# Patient Record
Sex: Female | Born: 1985
Health system: Southern US, Community
[De-identification: ages and names within clinical notes are randomized; demographics above are authoritative.]

## PROBLEM LIST (undated history)

## (undated) ENCOUNTER — Inpatient Hospital Stay (HOSPITAL_COMMUNITY): Payer: Self-pay

## (undated) DIAGNOSIS — M62838 Other muscle spasm: Secondary | ICD-10-CM

## (undated) DIAGNOSIS — Z975 Presence of (intrauterine) contraceptive device: Secondary | ICD-10-CM

## (undated) DIAGNOSIS — E559 Vitamin D deficiency, unspecified: Secondary | ICD-10-CM

## (undated) DIAGNOSIS — R519 Headache, unspecified: Secondary | ICD-10-CM

## (undated) DIAGNOSIS — R7303 Prediabetes: Secondary | ICD-10-CM

## (undated) DIAGNOSIS — F32A Depression, unspecified: Secondary | ICD-10-CM

## (undated) DIAGNOSIS — N393 Stress incontinence (female) (male): Secondary | ICD-10-CM

## (undated) DIAGNOSIS — T7840XA Allergy, unspecified, initial encounter: Secondary | ICD-10-CM

## (undated) DIAGNOSIS — J209 Acute bronchitis, unspecified: Secondary | ICD-10-CM

## (undated) DIAGNOSIS — F419 Anxiety disorder, unspecified: Secondary | ICD-10-CM

## (undated) DIAGNOSIS — M549 Dorsalgia, unspecified: Secondary | ICD-10-CM

## (undated) DIAGNOSIS — M2550 Pain in unspecified joint: Secondary | ICD-10-CM

## (undated) HISTORY — DX: Depression, unspecified: F32.A

## (undated) HISTORY — DX: Other muscle spasm: M62.838

## (undated) HISTORY — DX: Anxiety disorder, unspecified: F41.9

## (undated) HISTORY — DX: Dorsalgia, unspecified: M54.9

## (undated) HISTORY — DX: Prediabetes: R73.03

## (undated) HISTORY — DX: Pain in unspecified joint: M25.50

## (undated) HISTORY — PX: COLONOSCOPY: SHX174

## (undated) HISTORY — DX: Stress incontinence (female) (male): N39.3

## (undated) HISTORY — PX: NO PAST SURGERIES: SHX2092

## (undated) HISTORY — DX: Vitamin D deficiency, unspecified: E55.9

## (undated) HISTORY — DX: Allergy, unspecified, initial encounter: T78.40XA

## (undated) HISTORY — DX: Presence of (intrauterine) contraceptive device: Z97.5

## (undated) HISTORY — DX: Headache, unspecified: R51.9

---
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
## 2009-08-25 ENCOUNTER — Emergency Department (HOSPITAL_COMMUNITY): Admission: EM | Admit: 2009-08-25 | Discharge: 2009-08-25 | Payer: Self-pay | Admitting: Emergency Medicine

## 2009-08-28 ENCOUNTER — Emergency Department (HOSPITAL_COMMUNITY): Admission: EM | Admit: 2009-08-28 | Discharge: 2009-08-29 | Payer: Self-pay | Admitting: Emergency Medicine

## 2010-05-25 ENCOUNTER — Ambulatory Visit: Payer: Self-pay | Admitting: Obstetrics & Gynecology

## 2010-05-25 LAB — CONVERTED CEMR LAB: Pap Smear: NEGATIVE

## 2010-05-26 ENCOUNTER — Encounter (INDEPENDENT_AMBULATORY_CARE_PROVIDER_SITE_OTHER): Payer: Self-pay | Admitting: Family Medicine

## 2010-05-26 ENCOUNTER — Encounter: Payer: Self-pay | Admitting: Obstetrics & Gynecology

## 2010-05-26 LAB — CONVERTED CEMR LAB
Hemoglobin: 12.9 g/dL (ref 12.0–15.0)
MCHC: 32 g/dL (ref 30.0–36.0)
MCV: 84.3 fL (ref 78.0–100.0)
RBC: 4.78 M/uL (ref 3.87–5.11)
TSH: 1.609 microintl units/mL (ref 0.350–4.500)
Trich, Wet Prep: NONE SEEN
WBC: 6.5 10*3/uL (ref 4.0–10.5)

## 2010-06-05 ENCOUNTER — Ambulatory Visit: Payer: Self-pay | Admitting: Obstetrics and Gynecology

## 2010-11-16 LAB — POCT URINALYSIS DIPSTICK
Bilirubin Urine: NEGATIVE
Glucose, UA: NEGATIVE mg/dL
Ketones, ur: NEGATIVE mg/dL
Nitrite: POSITIVE — AB
pH: 6 (ref 5.0–8.0)

## 2011-01-19 NOTE — H&P (Signed)
[name], [name]              ACCOUNT NO.:  [id number]   MEDICAL RECORD NO.:  [id number]          PATIENT TYPE:  INP   LOCATION:  9168                          FACILITY:  [facility]   PHYSICIAN:  Naima A. Dillard, M.D. DATE OF BIRTH:  01-30-1986   DATE OF ADMISSION:  12/08/2005  DATE OF DISCHARGE:                                HISTORY & PHYSICAL   [name] is an 25 year old single black female, primigravida, at 63  weeks' gestation, who presents for induction of labor secondary to  postdates.  Her pregnancy has been followed by the [facility] OB[facility]GYN  MD Service and has been remarkable for:  (1) Questionable last menstrual  period.  (2) Smoker and quit smoking in the first trimester.  (3)  Depression.  (4) Obesity.  (5) Adolescent.  (6) Rubella is not immune.  (7)  Increased Down syndrome risk.  (8) Group B strep positive.  Her new OB were  collected on June 05, 2005.  Hemoglobin 12.3, hematocrit 37.8, platelets  286,000, blood type P positive, antibody negative, sickle cell trait  negative, RPR nonreactive, rubella nonimmune, hepatitis B surface antigen  negative, HIV not reactive, cystic fibrosis negative.  One hour Glucola from  July 03, 2005 was within normal limits.  Quad screen from July 03, 2005, showed increased Down syndrome risk.  One hour Glucola from September 26, 2005, was within normal limits.  The hemoglobin at that time was 12.9.  Culture of the vaginal tract for group B strep, gonorrhea, and Chlamydia on  November 06, 2005, showed positive group B strep and negative gonorrhea and  Chlamydia.   HISTORY OF PRESENT PREGNANCY:  The patient presented for care at [facility] on June 05, 2005, at [facility] OB[facility].  She had an  ultrasound on that day giving [facility] November 30, 2005 which is the best [facility] since  the patient had a questionable last menstrual period.  She was treated for a  bacterial vaginosis at that visit as well.  Ultrasonography at 18  weeks'  gestation shows growth consistent with previous dating.  All anatomy was  seen.  The patient had a 1 hour Glucola at that time due to obesity.  She  also had a quad screen drawn that day.  At 19 weeks, the patient was seen  for some sudden onset of abdominal cramping.  Her exam was normal.  She had  a visit at the following day with Dr. Normand Sloop to review her abnormal quad  screen.  The patient was referred to Dr. Sherrie George for further evaluation.  Dr.  Eber Jones ultrasound was normal for the patient.  Ultrasonography at 31  weeks' gestation showed normal anatomy, normal fluids, cephalic  presentation.  Sporadically, the patient had glycosuria with normal dextrose  sticks during her pregnancy.  She remained normotensive and no proteinuria  throughout.  Ultrasonography at 37-1/2 weeks was performed secondary to  patient measuring size less than dates.  [place] estimated fetal weight  percentile was at the 50th percentile, 7 pounds 4 ounces.  The patient had a  positive group B strep culture  with no penicillin allergy, and the treatment  was reviewed was reviewed with her, and the rest of her prenatal care has  been unremarkable.   OBSTETRICAL HISTORY:  She is a primigravida.   MEDICAL HISTORY:  1.  She has no medication allergies.  2.  She experienced menarche at the age of 32-9 with 28-30 days cycles      lasting 7 days.  3.  She reports having had the usual childhood illnesses.  4.  She has a history of acute bronchitis.  5.  She also has a history of depression and was never treated for that.   SURGICAL HISTORY:  Negative.   FAMILY MEDICAL HISTORY:  Remarkable for a father with chronic hypertension,  multiple family members with diabetes, mother with kidney removed, maternal  grandfather with stroke, paternal grandmother 2 paternal aunts and maternal  grandmother all have rheumatoid arthritis.  Mother has had breast cancer.   GENETIC HISTORY:  The patient had a heart murmur as a  baby that self-  resolved.  She has a paternal uncle with muscular dystrophy.   SOCIAL HISTORY:  The patient is single.  Father of the baby is involved.  His name is Sherry Ferguson.  The patient is of the [place] faith.  She is a high-  Garment/textile technologist.  Father of the baby has his GED and is employed full-time  as a Academic librarian.  The patient is unemployed.  The patient was a smoker and  stopped in the first trimester.  They deny any alcohol, tobacco, or illicit  drug use during the pregnancy.   OBJECTIVE DATA:  VITAL SIGNS:  Stable.  She is afebrile.  HEENT:  Grossly within normal limits.  CHEST:  Clear to auscultation.  HEART:  Regular rate and rhythm.  ABDOMEN:  Gravid and contour with fundal height extending approximately 40  cm above the pubic symphysis.  Fetal heart rate is reassuring.  PELVIC EXAM:  Performed in the office last week.  EXTREMITIES:  Normal.   ASSESSMENT:  1.  Intrauterine pregnancy at term.  2.  Favorable cervix.   PLAN:  1.  Admit to birthing suites per Dr. Normand Sloop.  2.  The patient will start on low-dose Pitocin.  3.  She will receive penicillin G for a group B strep prophylaxis.      Cam Hai, C.N.M.      Naima A. Normand Sloop, M.D.  Electronically Signed    KS/MEDQ  D:  12/08/2005  T:  12/08/2005  Job:  [id number]

## 2011-02-01 ENCOUNTER — Ambulatory Visit (INDEPENDENT_AMBULATORY_CARE_PROVIDER_SITE_OTHER): Payer: Managed Care, Other (non HMO) | Admitting: Family Medicine

## 2011-02-01 ENCOUNTER — Encounter: Payer: Self-pay | Admitting: Family Medicine

## 2011-02-01 VITALS — BP 130/80 | HR 78 | Ht 67.2 in | Wt 247.0 lb

## 2011-02-01 DIAGNOSIS — J45909 Unspecified asthma, uncomplicated: Secondary | ICD-10-CM | POA: Insufficient documentation

## 2011-02-01 DIAGNOSIS — Z0000 Encounter for general adult medical examination without abnormal findings: Secondary | ICD-10-CM

## 2011-02-01 DIAGNOSIS — E669 Obesity, unspecified: Secondary | ICD-10-CM

## 2011-02-01 LAB — POCT URINALYSIS DIPSTICK
Bilirubin, UA: NEGATIVE
Glucose, UA: NEGATIVE
Leukocytes, UA: NEGATIVE
Nitrite, UA: NEGATIVE
Urobilinogen, UA: NEGATIVE

## 2011-02-01 LAB — CBC WITH DIFFERENTIAL/PLATELET
Eosinophils Absolute: 0.1 10*3/uL (ref 0.0–0.7)
Hemoglobin: 13 g/dL (ref 12.0–15.0)
Lymphocytes Relative: 49 % — ABNORMAL HIGH (ref 12–46)
Lymphs Abs: 2.2 10*3/uL (ref 0.7–4.0)
Monocytes Relative: 7 % (ref 3–12)
Neutro Abs: 1.9 10*3/uL (ref 1.7–7.7)
Neutrophils Relative %: 41 % — ABNORMAL LOW (ref 43–77)
Platelets: 311 10*3/uL (ref 150–400)
RBC: 4.76 MIL/uL (ref 3.87–5.11)
WBC: 4.6 10*3/uL (ref 4.0–10.5)

## 2011-02-01 LAB — COMPREHENSIVE METABOLIC PANEL
ALT: 20 U/L (ref 0–35)
Albumin: 4.6 g/dL (ref 3.5–5.2)
CO2: 25 mEq/L (ref 19–32)
Chloride: 105 mEq/L (ref 96–112)
Potassium: 4.3 mEq/L (ref 3.5–5.3)
Sodium: 139 mEq/L (ref 135–145)
Total Bilirubin: 0.4 mg/dL (ref 0.3–1.2)
Total Protein: 7.6 g/dL (ref 6.0–8.3)

## 2011-02-01 LAB — LIPID PANEL
Cholesterol: 168 mg/dL (ref 0–200)
LDL Cholesterol: 109 mg/dL — ABNORMAL HIGH (ref 0–99)
VLDL: 19 mg/dL (ref 0–40)

## 2011-02-01 NOTE — Patient Instructions (Addendum)
Do the terminal extension exercise 3 sets of 10 3 times a day for the next 3 weeks Continue on your allergy medications and which her exercise regimen.

## 2011-02-01 NOTE — Progress Notes (Signed)
Subjective:    Patient ID: Sherry Ferguson, female    DOB: 04-30-1986, 25 y.o.   MRN: [id number]  HPI she is here for a complete examination. She does have underlying allergies and asthma. She has been seen recently by Dr. [place] Callas who recommended allergy desensitization. She is taking Singulair and albuterol as well as Qvar and seems to be doing quite nicely on it. Her other main concern is difficulty with bilateral knee pain she's had for the last year especially on the right. She has tried ibuprofen on occasion. She states that going up and downstairs cause difficulty. She does here a popping sensation but no locking or effusion. No previous history of injury to her knee. She did see her gynecologist in September. Her family and social history was reviewed. She is married and in school. She does have 2 children.    Review of Systems  Constitutional: Negative.   HENT: Negative.   Eyes: Negative.   Respiratory: Negative.   Cardiovascular: Negative.   Gastrointestinal: Negative.   Genitourinary: Negative.   Skin: Negative.   Neurological: Negative.   Psychiatric/Behavioral: Negative.        Objective:   Physical Exam BP 130/80  Pulse 78  Ht 5' 7.2" (1.707 m)  Wt 247 lb (112.038 kg)  BMI 38.46 kg/m2  LMP 01/12/2011  General Appearance:    Alert, cooperative, no distress, appears stated age  Head:    Normocephalic, without obvious abnormality, atraumatic  Eyes:    PERRL, conjunctiva/corneas clear, EOM's intact, fundi    benign  Ears:    Normal TM's and external ear canals  Nose:   Nares normal, mucosa normal, no drainage or sinus   tenderness  Throat:   Lips, mucosa, and tongue normal; teeth and gums normal  Neck:   Supple, no lymphadenopathy;  thyroid:  no   enlargement/tenderness/nodules; no carotid   bruit or JVD     Lungs:     Clear to auscultation bilaterally without wheezes, rales or     ronchi; respirations unlabored  Chest Wall:    No tenderness or deformity   Heart:     Regular rate and rhythm, S1 and S2 normal, no murmur, rub   or gallop  Breast Exam:    Deferred to GYN  Abdomen:     Soft, non-tender, nondistended, normoactive bowel sounds,    no masses, no hepatosplenomegaly  Genitalia:    Deferred to GYN     Extremities:   No clubbing, cyanosis or edema.exam of both knees does show crepitus on palpation of the patellae. Exam of both knees shows crepitus on palpation of the patellae. Anterior drawer and Murray's testing and negative.   Pulses:   2+ and symmetric all extremities  Skin:   Skin color, texture, turgor normal, no rashes or lesions  Lymph nodes:   Cervical, supraclavicular, and axillary nodes normal  Neurologic:   CNII-XII intact, normal strength, sensation and gait; reflexes 2+ and symmetric throughout          Psych:   Normal mood, affect, hygiene and grooming.          Assessment & Plan:  Allergic rhinitis. Asthma. Obesity. Bilateral chondromalacia patella. I will do routine blood screening on her. Also discussed diet and exercise with her especially in regard to carbohydrates. Encouraged her to remain as physically active as she can. Discussed ideal weight for her which would be below 150 pounds. Also instructed her on terminal extension exercises for her knees and she  will return here if continued difficulty.

## 2011-02-02 ENCOUNTER — Encounter: Payer: Self-pay | Admitting: Family Medicine

## 2011-02-02 ENCOUNTER — Telehealth: Payer: Self-pay

## 2011-02-02 DIAGNOSIS — J45909 Unspecified asthma, uncomplicated: Secondary | ICD-10-CM

## 2011-02-02 NOTE — Telephone Encounter (Signed)
CALLED PT LEFT MESSAGE THAT LABS LOOK GOOD

## 2011-02-02 NOTE — Assessment & Plan Note (Signed)
PT SEES [facility] FOR ALLERGIES

## 2011-04-30 ENCOUNTER — Other Ambulatory Visit: Payer: Self-pay | Admitting: Medical

## 2011-04-30 ENCOUNTER — Encounter: Payer: Self-pay | Admitting: Medical

## 2011-04-30 ENCOUNTER — Ambulatory Visit (INDEPENDENT_AMBULATORY_CARE_PROVIDER_SITE_OTHER): Payer: Managed Care, Other (non HMO) | Admitting: Medical

## 2011-04-30 DIAGNOSIS — R109 Unspecified abdominal pain: Secondary | ICD-10-CM

## 2011-04-30 DIAGNOSIS — R110 Nausea: Secondary | ICD-10-CM

## 2011-04-30 DIAGNOSIS — R102 Pelvic and perineal pain: Secondary | ICD-10-CM

## 2011-04-30 DIAGNOSIS — N949 Unspecified condition associated with female genital organs and menstrual cycle: Secondary | ICD-10-CM

## 2011-04-30 LAB — POCT URINE PREGNANCY: Preg Test, Ur: NEGATIVE

## 2011-04-30 LAB — POCT URINALYSIS DIPSTICK
Bilirubin, UA: NEGATIVE
Glucose, UA: NEGATIVE
Nitrite, UA: NEGATIVE
Spec Grav, UA: 1.015
Urobilinogen, UA: NEGATIVE

## 2011-04-30 MED ORDER — HYDROCODONE-ACETAMINOPHEN 5-500 MG PO TABS: 1.0000 | ORAL_TABLET | Freq: Four times a day (QID) | ORAL | Status: AC | PRN

## 2011-04-30 MED ORDER — PROMETHAZINE HCL 25 MG PO TABS: 25.0000 mg | ORAL_TABLET | Freq: Four times a day (QID) | ORAL | Status: AC | PRN

## 2011-04-30 NOTE — Progress Notes (Signed)
  Subjective:   HPI  Sherry Ferguson is a 25 y.o. female who presents for 1 day hx/o moderate to severe abdominal cramping pain, like a severe menstrual cramp.  She is a P71G2 female, both children full term vaginal delivery prior.  She has Mirena contraception currently.  She notes no prior similar pain.  Last evening at 8pm started having severe lower cramping.  Pain was so bad she couldn't move or talk.  Husband was going to take her to the ED, but she declined.  The pain has subsided somewhat over the last 8 hours or so.  She still is having lots of cramping, lower abdominal pain.   Pain worse with walking or sitting.  Seems to be a little better supine.  She notes nausea and sweats last evening with the pain.  No other aggravating or relieving factors.  No other c/o.  The following portions of the patient's history were reviewed and updated as appropriate: allergies, current medications, past family history, past medical history, past social history, past surgical history and problem list.  Past Medical History  Diagnosis Date  . Allergy   . Asthma   . IUD (intrauterine device) in place     Review of Systems Constitutional: +sweats;  denies fever, chills, unexpected weight change, anorexia, fatigue Dermatology: denies rash Cardiology: denies chest pain, palpitations, edema Respiratory: denies cough, shortness of breath, wheezing Gastroenterology: +abdominal pain, cramping, nausea;  Denies vomiting, diarrhea, constipation, BMs regular Musculoskeletal: denies arthralgias, myalgias, joint swelling, back pain Urology: denies dysuria, difficulty urinating, hematuria, urinary frequency, urgency Neurology: no headache, weakness, tingling, numbness GYN; no discharge, no hx/o STD, no pelvic pain with intercourse, no bleeding, LMP 04/08/11     Objective:   Physical Exam  General appearance: alert, no distress, WD/WN, black female, obese Neck: supple, no lymphadenopathy, no thyromegaly, no  masses Heart: RRR, normal S1, S2, no murmurs Lungs: CTA bilaterally, no wheezes, rhonchi, or rales Abdomen: +bs,soft, moderate generalized tenderness, non distended, no masses, no hepatomegaly, no splenomegaly Back: non tender Musculoskeletal: LE nontender, no swelling, no obvious deformity, normal ROM Extremities: no edema, no cyanosis, no clubbing Pulses: 2+ symmetric, upper and lower extremities, normal cap refill Psychiatric: normal affect, behavior normal, pleasant  Pelvic: normal external genitalia, slight white discharge in vault, no cervical erythema, Mirena strings in place, moderate adnexal and uterine tenderness, but no mass, no cervical motion tenderness, swabs taken, exam chaperoned by nurse   Assessment :    Encounter Diagnoses  Name Primary?  . Abdominal pain Yes  . Pelvic pain in female   . Nausea       Plan:   Wet prep today with few clue cells, otherwise no yeast, no trich, no WBC, basically unremarkable.  Urinalysis and pregnancy negative.  Discussed differential which could include ovarian cyst, cyst rupture, uterine problem, renal stone, or otherwise GI or GU problems.  Will set up for ultrasound, send for labs and STD screening.  Will call with results and plan.  In the meantime, script given for Promethazine and Lortab prn use.  Call/return if worse.  Otherwise, f/u with ultrasound this week as scheduled.

## 2011-04-30 NOTE — Patient Instructions (Signed)
Pelvic Pain Pelvic pain is pain below the belly button and located between your hips. Acute pain may last a few hours or days. Chronic pelvic pain may last weeks and months. The cause these different types of pelvic may be different. The pain may be dull or sharp, mild or severe and can interfere with your daily activities. Write down and tell your caregiver:   Exactly where the pain is located.   If it comes and goes or is there all the time.   When it happens (with sex, urination, bowel movement, etc.)   If the pain is related to your menstrual period or stress.  Your caregiver will take a full history and do a complete physical exam and Pap test. CAUSES  Painful menstrual periods (dysmenorrhea).   Normal ovulation (Mittelschmertz) that occurs in the middle of the menstrual cycle every month.   The pelvic organs get engorged with blood just before the menstrual period (pelvic congestive syndrome).   Scar tissue from an infection or past surgery (pelvic adhesions).   Cancer of the female pelvic organs. When there is pain with cancer, it has been there for a long time.   The lining of the uterus (endometrium) abnormally grows in places like the pelvis and on the pelvic organs (endometriosis).   A form of endometriosis with the lining of the uterus present inside of the muscle tissue of the uterus (adenomyosis).   Fibroid tumor (noncancerous) in the uterus.   Bladder problems such as infection, bladder spasms of the muscle tissue of the bladder.   Intestinal problems (irritable bowel syndrome, colitis, an ulcer or gastrointestinal infection).   Polyps of the cervix or uterus.   Pregnancy in the tube (ectopic pregnancy).   The opening of the cervix is too small for the menstrual blood to flow through it (cervical stenosis).   Physical or sexual abuse (past or present).   Musculo-skeletal problems from poor posture, problems with the vertebrae of the lower back or the uterine  pelvic muscles falling (prolapse).   Psychological problems such as depression or stress.   IUD (intrauterine device) in the uterus.  DIAGNOSIS Tests to make a diagnosis depends on the type, location, severity and what causes the pain to occur. Tests that may be needed include:  Blood tests.  Urine tests   Ultrasound.   X-rays.  CT Scan.   MRI.  Laparoscopy.   Major surgery.   TREATMENT Treatment will depend on the cause of the pain, which includes:  Prescription or over-the-counter pain medication.  Antibiotics.   Birth control pills.   Hormone treatment.  Nerve blocking injections.   Physical therapy.   Antidepressants.  Counseling with a psychiatrist or psychologist.   Minor or major surgery.   HOME CARE INSTRUCTIONS  Only take over-the-counter or prescription medicines for pain, discomfort or fever as directed by your caregiver.   Follow your caregiver's advice to treat your pain.   Rest.   Avoid sexual intercourse if it causes the pain.   Apply warm or cold compresses (which ever works best) to the pain area.   Do relaxation exercises such as yoga or meditation.   Try acupuncture.   Avoid stressful situations.   Try group therapy.   If the pain is because of a stomach/intestinal upset, drink clear liquids, eat a bland light food diet until the symptoms go away.  SEEK MEDICAL CARE IF:  You need stronger prescription pain medication.   You develop pain with sexual intercourse.  You have pain with urination.   You develop a temperature of 101 with the pain.   You are still in pain after 4 hours of taking prescription medication for the pain.   You need depression medication.   Your IUD is causing pain and you want it removed.  SEEK IMMEDIATE MEDICAL CARE IF YOU DEVELOP:  Very severe pain or tenderness.   Fainting, chills, severe weakness or dehydration.   Heavy vaginal bleeding or passing solid tissue.   You develop a temperature  of 101 with the pain.   You have blood in the urine.   You are being physically or sexually abused.   You have uncontrolled vomiting and diarrhea.   You are depressed and afraid of harming yourself or someone else.  Document Released: 09/27/2004 Document Re-Released: 06/22/2008 [facility] Patient Information 2011 [place], [place].

## 2011-05-01 ENCOUNTER — Telehealth: Payer: Self-pay | Admitting: *Deleted

## 2011-05-01 LAB — COMPREHENSIVE METABOLIC PANEL
AST: 14 U/L (ref 0–37)
Alkaline Phosphatase: 69 U/L (ref 39–117)
BUN: 17 mg/dL (ref 6–23)
Creat: 0.68 mg/dL (ref 0.50–1.10)

## 2011-05-01 LAB — GC/CHLAMYDIA PROBE AMP, GENITAL: Chlamydia, DNA Probe: NEGATIVE

## 2011-05-01 LAB — CBC
HCT: 40.9 % (ref 36.0–46.0)
Hemoglobin: 13.1 g/dL (ref 12.0–15.0)
MCH: 27.5 pg (ref 26.0–34.0)
MCHC: 32 g/dL (ref 30.0–36.0)
MCV: 85.7 fL (ref 78.0–100.0)

## 2011-05-01 NOTE — Telephone Encounter (Addendum)
Message copied by Dorthula Perfect on Tue May 01, 2011  4:18 PM ------      Message from: Aleen Campi, DAVID S      Created: Tue May 01, 2011  1:40 PM       CMP, CBC, UA, Upreg, amylase, lipase ALL normal.  GC/Chlamydia culture normal.  We will call with results of abdominal and pelvic ultrasounds.  How are her symptoms today?   Pt notified of lab and culture results.  Pt stated that her symptoms are still the same.  Will notify pt when ultrasounds of abdomen and pelvic come in of results.  CM, LPN

## 2011-05-03 ENCOUNTER — Ambulatory Visit
Admission: RE | Admit: 2011-05-03 | Discharge: 2011-05-03 | Disposition: A | Discharge status: Home / Self Care | Payer: Managed Care, Other (non HMO) | Source: Ambulatory Visit | Attending: Medical | Admitting: Medical

## 2011-05-03 DIAGNOSIS — R102 Pelvic and perineal pain: Secondary | ICD-10-CM

## 2011-05-08 ENCOUNTER — Telehealth: Payer: Self-pay | Admitting: Medical

## 2011-05-08 NOTE — Telephone Encounter (Signed)
Message copied by Jac Canavan on Tue May 08, 2011  8:08 AM ------      Message from: Jac Canavan      Created: Tue May 08, 2011  6:29 AM       Call this morning for status update

## 2011-05-08 NOTE — Telephone Encounter (Signed)
I called and left msg.   I saw her recently for bad crampy lower abdominal pain, and we had workup including labs and ultrasound showing ovarian cyst.  I will await her call back.

## 2011-05-16 ENCOUNTER — Emergency Department (HOSPITAL_COMMUNITY): Payer: Managed Care, Other (non HMO)

## 2011-05-16 ENCOUNTER — Emergency Department (HOSPITAL_COMMUNITY)
Admission: EM | Admit: 2011-05-16 | Discharge: 2011-05-16 | Disposition: A | Discharge status: Home / Self Care | Payer: Managed Care, Other (non HMO) | Attending: Emergency Medicine | Admitting: Emergency Medicine

## 2011-05-16 DIAGNOSIS — R0789 Other chest pain: Secondary | ICD-10-CM | POA: Insufficient documentation

## 2011-05-16 DIAGNOSIS — M79609 Pain in unspecified limb: Secondary | ICD-10-CM | POA: Insufficient documentation

## 2011-05-16 DIAGNOSIS — R51 Headache: Secondary | ICD-10-CM | POA: Insufficient documentation

## 2011-09-05 ENCOUNTER — Emergency Department (HOSPITAL_BASED_OUTPATIENT_CLINIC_OR_DEPARTMENT_OTHER)
Admission: EM | Admit: 2011-09-05 | Discharge: 2011-09-05 | Disposition: A | Discharge status: Home / Self Care | Payer: Managed Care, Other (non HMO) | Attending: Emergency Medicine | Admitting: Emergency Medicine

## 2011-09-05 ENCOUNTER — Encounter (HOSPITAL_BASED_OUTPATIENT_CLINIC_OR_DEPARTMENT_OTHER): Payer: Self-pay | Admitting: Emergency Medicine

## 2011-09-05 DIAGNOSIS — J45909 Unspecified asthma, uncomplicated: Secondary | ICD-10-CM | POA: Insufficient documentation

## 2011-09-05 DIAGNOSIS — W57XXXA Bitten or stung by nonvenomous insect and other nonvenomous arthropods, initial encounter: Secondary | ICD-10-CM | POA: Insufficient documentation

## 2011-09-05 DIAGNOSIS — L0390 Cellulitis, unspecified: Secondary | ICD-10-CM

## 2011-09-05 DIAGNOSIS — S40269A Insect bite (nonvenomous) of unspecified shoulder, initial encounter: Secondary | ICD-10-CM | POA: Insufficient documentation

## 2011-09-05 MED ORDER — CEPHALEXIN 250 MG PO CAPS
500.0000 mg | ORAL_CAPSULE | Freq: Once | ORAL | Status: AC
  Administered 2011-09-05: 500 mg via ORAL
  Filled 2011-09-05: qty 2

## 2011-09-05 MED ORDER — CEPHALEXIN 500 MG PO CAPS: 500.0000 mg | ORAL_CAPSULE | Freq: Four times a day (QID) | ORAL | Status: AC

## 2011-09-05 NOTE — ED Provider Notes (Signed)
History     CSN: [id number]  Arrival date & time 09/05/11  2200   First MD Initiated Contact with Patient 09/05/11 2311      Chief Complaint  Patient presents with  . Insect Bite    (Consider location/radiation/quality/duration/timing/severity/associated sxs/prior treatment) The history is provided by the patient.   Pt c/o sore area post right upper arm, possible insect bite. Says first noted a couple days ago w two tiny bumps. Now has approx 1 cm diameter scab over area and notes surrounding skin is sl itchy and sore, mild redness. No hx same symptoms. No hx boil,abscess or mrsa. No axillary pain. States a day or so after onset, that family/friend saw a spider in the same area she had been sleeping. No definite bite or sting noted. Otherwise feels well. No fever or chills. No nv.  Past Medical History  Diagnosis Date  . Allergy   . Asthma   . IUD (intrauterine device) in place     History reviewed. No pertinent past surgical history.  Family History  Problem Relation Age of Onset  . Asthma Mother   . Diabetes Father   . Hyperlipidemia Father   . Hypertension Father     History  Substance Use Topics  . Smoking status: Former Smoker    Types: Cigarettes    Quit date: 09/04/2007  . Smokeless tobacco: Never Used  . Alcohol Use: 0.6 oz/week    1 Glasses of wine per week    OB History    Grav Para Term Preterm Abortions TAB SAB Ect Mult Living                  Review of Systems  Constitutional: Negative for fever and chills.  Musculoskeletal: Negative for joint swelling and arthralgias.  Skin: Negative for rash.  Neurological: Negative for numbness.    Allergies  Review of patient's allergies indicates no known allergies.  Home Medications   Current Outpatient Rx  Name Route Sig Dispense Refill  . ALBUTEROL SULFATE HFA 108 (90 BASE) MCG/ACT IN AERS Inhalation Inhale 2 puffs into the lungs every 6 (six) hours as needed. For shortness of breath and wheezing      . BECLOMETHASONE DIPROPIONATE 80 MCG/ACT IN AERS Inhalation Inhale 1 puff into the lungs as needed. For shortness of breath and wheezing    . IBUPROFEN 200 MG PO TABS Oral Take 600 mg by mouth every 6 (six) hours as needed. For pain     . MONTELUKAST SODIUM 10 MG PO TABS Oral Take 10 mg by mouth at bedtime.      Marland Kitchen LEVONORGESTREL 20 MCG/24HR IU IUD Intrauterine 1 each by Intrauterine route once.        Pulse 80  Temp(Src) 97.9 F (36.6 C) (Oral)  Resp 18  SpO2 100%  Physical Exam  Nursing note and vitals reviewed. Constitutional: She appears well-developed and well-nourished. No distress.  Eyes: Conjunctivae are normal. No scleral icterus.  Neck: Neck supple. No tracheal deviation present.  Cardiovascular: Normal rate.   Pulmonary/Chest: Effort normal. No respiratory distress.  Abdominal: Normal appearance.  Musculoskeletal: She exhibits no edema.  Neurological: She is alert.  Skin: Skin is warm and dry. No rash noted.       Pt with approx 1 cm diameter area to posterior right upper arm of scab, no purulent drainage from scabbed area. No fluctuance or abscess. Mild surround erythema a few cm. No axillary l/a/ no lymphangitis. Distal pulses palp.   Psychiatric: She  has a normal mood and affect.    ED Course  Procedures (including critical care time)     MDM  Discussed w pt, possible insect/spider bite or sting w early/v mild infxn/cellulitis. No fluctuance or abscess. No necrotic tissue.  Confirmed nkda w pt.  Keflex po.         Suzi Roots, MD 09/05/11 [id number]

## 2011-09-05 NOTE — ED Notes (Signed)
Pt reports possible spider bite. Pt has abscess to right posterior upper arm

## 2011-12-06 ENCOUNTER — Encounter (HOSPITAL_COMMUNITY): Payer: Self-pay | Admitting: Emergency Medicine

## 2011-12-06 ENCOUNTER — Emergency Department (HOSPITAL_COMMUNITY)
Admission: EM | Admit: 2011-12-06 | Discharge: 2011-12-06 | Disposition: A | Discharge status: Home / Self Care | Payer: Managed Care, Other (non HMO) | Attending: Emergency Medicine | Admitting: Emergency Medicine

## 2011-12-06 DIAGNOSIS — IMO0001 Reserved for inherently not codable concepts without codable children: Secondary | ICD-10-CM | POA: Insufficient documentation

## 2011-12-06 DIAGNOSIS — J45909 Unspecified asthma, uncomplicated: Secondary | ICD-10-CM | POA: Insufficient documentation

## 2011-12-06 DIAGNOSIS — J329 Chronic sinusitis, unspecified: Secondary | ICD-10-CM

## 2011-12-06 DIAGNOSIS — Z79899 Other long term (current) drug therapy: Secondary | ICD-10-CM | POA: Insufficient documentation

## 2011-12-06 MED ORDER — HYDROCOD POLST-CHLORPHEN POLST 10-8 MG/5ML PO LQCR: 5.0000 mL | Freq: Two times a day (BID) | ORAL | Status: DC | PRN

## 2011-12-06 MED ORDER — AMOXICILLIN-POT CLAVULANATE 875-125 MG PO TABS: 1.0000 | ORAL_TABLET | Freq: Two times a day (BID) | ORAL | Status: AC

## 2011-12-06 MED ORDER — ALBUTEROL SULFATE HFA 108 (90 BASE) MCG/ACT IN AERS
1.0000 | INHALATION_SPRAY | Freq: Four times a day (QID) | RESPIRATORY_TRACT | Status: DC | PRN
  Administered 2011-12-06: 2 via RESPIRATORY_TRACT
  Filled 2011-12-06: qty 6.7

## 2011-12-06 MED ORDER — ACETAMINOPHEN 325 MG PO TABS
650.0000 mg | ORAL_TABLET | Freq: Once | ORAL | Status: AC
Start: 2011-12-06 — End: 2011-12-06
  Administered 2011-12-06: 650 mg via ORAL
  Filled 2011-12-06: qty 2

## 2011-12-06 MED ORDER — HYDROCOD POLST-CHLORPHEN POLST 10-8 MG/5ML PO LQCR
5.0000 mL | Freq: Once | ORAL | Status: AC
  Administered 2011-12-06: 5 mL via ORAL
  Filled 2011-12-06: qty 5

## 2011-12-06 MED ORDER — AEROCHAMBER PLUS W/MASK LARGE MISC
1.0000 | Freq: Once | Status: DC
  Filled 2011-12-06: qty 1

## 2011-12-06 MED ORDER — AMOXICILLIN-POT CLAVULANATE 875-125 MG PO TABS: 1.0000 | ORAL_TABLET | Freq: Two times a day (BID) | ORAL | Status: DC

## 2011-12-06 NOTE — Discharge Instructions (Signed)

## 2011-12-06 NOTE — ED Notes (Signed)
Pt states she has had a fever off and on since Easter  Pt has been taking ibuprofen  Pt states she has headache, sore throat, abd cramping, hot flashes alternating with chills, sweating, light headed, nausea, and dizziness

## 2011-12-06 NOTE — ED Provider Notes (Signed)
History     CSN: [id number]  Arrival date & time 12/06/11  0418   First MD Initiated Contact with Patient 12/06/11 [phone]      Chief Complaint  Patient presents with  . Fever  . Sore Throat  . Generalized Body Aches    (Consider location/radiation/quality/duration/timing/severity/associated sxs/prior treatment) HPI Comments: Sports for 4-5 days she has been experiencing intermittent fevers with chills, generalized weakness and fatigue. She reports that she'll intermittently have headaches, episodes of dizziness without chest pain or syncope, nausea without vomiting and no diarrhea. Ibuprofen at home with minimal relief. It is allergies, used to be on Singulair although is currently not taking anything on a daily basis. Sinus congestion, pressure, sore throat or cough he swallowing due to the pain. Skin rash or stiff neck. Denies cervical lymphadenopathy. Has a significant history of asthma. She reports that she cannot currently find her albuterol inhaler. In addition she has a nebulizer but a piece of it is missing and ask if that piece can be replaced.  Patient is a 26 y.o. female presenting with fever and pharyngitis. The history is provided by the patient and a friend.  Fever Primary symptoms of the febrile illness include fever, fatigue, headaches, cough, nausea and myalgias. Primary symptoms do not include wheezing, shortness of breath, abdominal pain, diarrhea or rash.  The headache is associated with weakness. The headache is not associated with neck stiffness.   The myalgias are associated with weakness.   Sore Throat Associated symptoms include headaches. Pertinent negatives include no chest pain, no abdominal pain and no shortness of breath.    Past Medical History  Diagnosis Date  . Allergy   . Asthma   . IUD (intrauterine device) in place     History reviewed. No pertinent past surgical history.  Family History  Problem Relation Age of Onset  . Asthma Mother   .  Diabetes Father   . Hyperlipidemia Father   . Hypertension Father     History  Substance Use Topics  . Smoking status: Former Smoker    Types: Cigarettes    Quit date: 09/04/2007  . Smokeless tobacco: Never Used  . Alcohol Use: No    OB History    Grav Para Term Preterm Abortions TAB SAB Ect Mult Living                  Review of Systems  Constitutional: Positive for fever, chills, appetite change and fatigue.  HENT: Positive for congestion, sore throat, trouble swallowing and sinus pressure. Negative for ear pain and neck stiffness.   Respiratory: Positive for cough. Negative for shortness of breath and wheezing.   Cardiovascular: Negative for chest pain.  Gastrointestinal: Positive for nausea. Negative for abdominal pain and diarrhea.  Musculoskeletal: Positive for myalgias.  Skin: Negative for rash.  Neurological: Positive for dizziness, weakness and headaches. Negative for syncope.    Allergies  Review of patient's allergies indicates no known allergies.  Home Medications   Current Outpatient Rx  Name Route Sig Dispense Refill  . ALBUTEROL SULFATE HFA 108 (90 BASE) MCG/ACT IN AERS Inhalation Inhale 2 puffs into the lungs every 6 (six) hours as needed. For shortness of breath and wheezing     . BECLOMETHASONE DIPROPIONATE 80 MCG/ACT IN AERS Inhalation Inhale 1 puff into the lungs as needed. For shortness of breath and wheezing    . IBUPROFEN 200 MG PO TABS Oral Take 600 mg by mouth every 6 (six) hours as needed. For pain     .  LEVONORGESTREL 20 MCG/24HR IU IUD Intrauterine 1 each by Intrauterine route once.      . AMOXICILLIN-POT CLAVULANATE 875-125 MG PO TABS Oral Take 1 tablet by mouth 2 (two) times daily. 28 tablet 0  . HYDROCOD POLST-CPM POLST ER 10-8 MG/5ML PO LQCR Oral Take 5 mLs by mouth every 12 (twelve) hours as needed. 140 mL 0    BP 108/73  Pulse 108  Temp(Src) 98.1 F (36.7 C) (Oral)  Resp 18  SpO2 99%  LMP 11/16/2011  Physical Exam  Nursing note  and vitals reviewed. Constitutional: She is oriented to person, place, and time. She appears well-developed and well-nourished.  HENT:  Head: Normocephalic and atraumatic.  Nose: Right sinus exhibits maxillary sinus tenderness and frontal sinus tenderness. Left sinus exhibits maxillary sinus tenderness and frontal sinus tenderness.  Mouth/Throat: Uvula is midline and mucous membranes are normal. Oropharyngeal exudate present. No tonsillar abscesses.  Eyes: Pupils are equal, round, and reactive to light.  Neck: Trachea normal, normal range of motion and phonation normal. Neck supple. No thyromegaly present.  Cardiovascular: Regular rhythm.   Pulmonary/Chest: Effort normal and breath sounds normal. She has no wheezes. She has no rales.  Abdominal: Soft. There is no hepatosplenomegaly. There is no tenderness. There is no rebound, no guarding and no CVA tenderness.  Neurological: She is alert and oriented to person, place, and time.    ED Course  Procedures (including critical care time)  Labs Reviewed - No data to display No results found.   1. Sinusitis       MDM  Clinically the patient has a sinus postnasal drip, some posterior tonsillar exudate is present. No cervical adenopathy.. My plan is to put her on 2 weeks of antibiotics. Will ask RT to try to replace piece.  No resp distress.  Tussionex for treatment.          Gavin Pound. Oletta Lamas, MD 12/06/11 [phone]

## 2011-12-06 NOTE — ED Notes (Signed)
Pt reports having flu like symptoms and sore throat since Easter. States that she has been taking Ibuprofen with little relief. States she has not taken any OTC cold/flu medicine. Pt states she is nauseated, weak, and sore. Pt has a strong cough though nonproductive. Bilateral breath sounds CTA. Pt denies smoking cigarettes.

## 2011-12-07 ENCOUNTER — Emergency Department (HOSPITAL_COMMUNITY)
Admission: EM | Admit: 2011-12-07 | Discharge: 2011-12-07 | Disposition: A | Discharge status: Home / Self Care | Payer: Managed Care, Other (non HMO) | Attending: Emergency Medicine | Admitting: Emergency Medicine

## 2011-12-07 ENCOUNTER — Encounter (HOSPITAL_COMMUNITY): Payer: Self-pay | Admitting: Emergency Medicine

## 2011-12-07 DIAGNOSIS — J029 Acute pharyngitis, unspecified: Secondary | ICD-10-CM | POA: Insufficient documentation

## 2011-12-07 DIAGNOSIS — R000 Tachycardia, unspecified: Secondary | ICD-10-CM | POA: Insufficient documentation

## 2011-12-07 DIAGNOSIS — J45909 Unspecified asthma, uncomplicated: Secondary | ICD-10-CM | POA: Insufficient documentation

## 2011-12-07 DIAGNOSIS — J329 Chronic sinusitis, unspecified: Secondary | ICD-10-CM | POA: Insufficient documentation

## 2011-12-07 DIAGNOSIS — Z87891 Personal history of nicotine dependence: Secondary | ICD-10-CM | POA: Insufficient documentation

## 2011-12-07 MED ORDER — IBUPROFEN 600 MG PO TABS: 600.0000 mg | ORAL_TABLET | Freq: Four times a day (QID) | ORAL | Status: DC | PRN

## 2011-12-07 MED ORDER — IBUPROFEN 800 MG PO TABS
800.0000 mg | ORAL_TABLET | Freq: Once | ORAL | Status: AC
  Administered 2011-12-07: 800 mg via ORAL
  Filled 2011-12-07: qty 1

## 2011-12-07 MED ORDER — DEXAMETHASONE SODIUM PHOSPHATE 10 MG/ML IJ SOLN
10.0000 mg | Freq: Once | INTRAMUSCULAR | Status: AC
  Administered 2011-12-07: 10 mg via INTRAMUSCULAR
  Filled 2011-12-07: qty 1

## 2011-12-07 MED ORDER — OXYCODONE-ACETAMINOPHEN 5-325 MG/5ML PO SOLN: 5.0000 mL | Freq: Four times a day (QID) | ORAL | Status: AC | PRN

## 2011-12-07 NOTE — ED Notes (Signed)
Pt. Reports being tx here yesterday for sinus infx. States she has began to feel worse and her throat is hurting from the back of her mouth all the way into the throat. States she is unable to eat or drink and is fatigue. Reports her neck is also sore. States pain is an "18" on a scale 1-10. States when she coughs the phlegm is red. Reports dizziness when she stands. Reports she woke up this morning nausiated and dry heaved, nothing came up.

## 2011-12-07 NOTE — ED Provider Notes (Signed)
History     CSN: [id number]  Arrival date & time 12/07/11  1534   First MD Initiated Contact with Patient 12/07/11 1621      Chief Complaint  Patient presents with  . Sore Throat    (Consider location/radiation/quality/duration/timing/severity/associated sxs/prior treatment) The history is provided by the patient.   the patient is a generally healthy 67 female who presents to the emergency department for the second time in 2 days with a chief complaint of sore throat that began 5 days ago. There is an associated fever, chills, fatigue, headache, ear pain, cough, facial pain, neck pain and nausea only this morning. There is no associated visual change, change in hearing, chest pain, shortness of breath, abdominal pain, rash. He has been taking Tylenol and ibuprofen intermittently at home which have been successful in reducing her fever, though she has not taken any since yesterday. At her visit yesterday she was given a prescription for an antibiotic which she has started taking. She is returned to the emergency department for reevaluation because she feels that the pain in her throat is getting worse and the medication she was given yesterday for her throat pain is not effective enough to allow her to eat and drink (though she is able to swallow).  Past Medical History  Diagnosis Date  . Allergy   . Asthma   . IUD (intrauterine device) in place     History reviewed. No pertinent past surgical history.  Family History  Problem Relation Age of Onset  . Asthma Mother   . Diabetes Father   . Hyperlipidemia Father   . Hypertension Father     History  Substance Use Topics  . Smoking status: Former Smoker    Types: Cigarettes    Quit date: 09/04/2007  . Smokeless tobacco: Never Used  . Alcohol Use: No     Review of Systems  Constitutional: Positive for fever, chills and fatigue.  HENT: Positive for ear pain, congestion, sore throat, trouble swallowing, neck pain, postnasal drip  and sinus pressure. Negative for hearing loss, facial swelling, mouth sores, neck stiffness, voice change, tinnitus and ear discharge.   Eyes: Negative for pain and visual disturbance.  Respiratory: Positive for cough. Negative for chest tightness and shortness of breath.   Cardiovascular: Negative for chest pain and palpitations.  Gastrointestinal: Positive for nausea. Negative for vomiting and abdominal pain.  Musculoskeletal: Positive for myalgias. Negative for back pain and gait problem.  Skin: Negative for rash.  Neurological: Positive for dizziness, weakness and headaches. Negative for syncope.  Psychiatric/Behavioral: Negative for confusion.    Allergies  Review of patient's allergies indicates no known allergies.  Home Medications   Current Outpatient Rx  Name Route Sig Dispense Refill  . ALBUTEROL SULFATE HFA 108 (90 BASE) MCG/ACT IN AERS Inhalation Inhale 2 puffs into the lungs every 6 (six) hours as needed. For shortness of breath and wheezing     . AMOXICILLIN-POT CLAVULANATE 875-125 MG PO TABS Oral Take 1 tablet by mouth 2 (two) times daily. 28 tablet 0  . ASPIRIN-ACETAMINOPHEN-CAFFEINE 250-250-65 MG PO TABS Oral Take 1 tablet by mouth every 6 (six) hours as needed.    . BECLOMETHASONE DIPROPIONATE 80 MCG/ACT IN AERS Inhalation Inhale 1 puff into the lungs as needed. For shortness of breath and wheezing    . HYDROCOD POLST-CPM POLST ER 10-8 MG/5ML PO LQCR Oral Take 5 mLs by mouth every 12 (twelve) hours as needed. 140 mL 0  . OMEGA-3 FATTY ACIDS 1000 MG  PO CAPS Oral Take 2 g by mouth daily.    . IBUPROFEN 200 MG PO TABS Oral Take 600 mg by mouth every 6 (six) hours as needed. For pain     . LEVONORGESTREL 20 MCG/24HR IU IUD Intrauterine 1 each by Intrauterine route once.      . ADULT MULTIVITAMIN W/MINERALS CH Oral Take 1 tablet by mouth daily.      BP 105/74  Pulse 109  Temp(Src) 101.9 F (38.8 C) (Oral)  Resp 18  SpO2 96%  LMP 11/16/2011  Physical Exam    Constitutional: She is oriented to person, place, and time. She appears well-developed and well-nourished. Distressed: uncomfortable appearing.       Vital signs reviewed, significant for fever and tachycardia  HENT:  Head: Normocephalic and atraumatic. No trismus in the jaw.  Right Ear: Hearing, tympanic membrane, external ear and ear canal normal.  Left Ear: Hearing, tympanic membrane, external ear and ear canal normal.  Nose: Mucosal edema present. No rhinorrhea. Right sinus exhibits maxillary sinus tenderness and frontal sinus tenderness. Left sinus exhibits maxillary sinus tenderness and frontal sinus tenderness.  Mouth/Throat: Mucous membranes are normal. No uvula swelling. Oropharyngeal exudate, posterior oropharyngeal edema and posterior oropharyngeal erythema present. No tonsillar abscesses.  Cardiovascular: Regular rhythm and normal heart sounds.        Tachycardia with a rate of 104 palpated on examination  Pulmonary/Chest: Effort normal and breath sounds normal. No respiratory distress. She has no wheezes. She exhibits no tenderness.  Abdominal: Soft. Bowel sounds are normal. She exhibits no distension. There is no tenderness.  Musculoskeletal: She exhibits no edema and no tenderness.  Neurological: She is alert and oriented to person, place, and time. No cranial nerve deficit.  Skin: Skin is warm and dry. No rash noted.  Psychiatric: She has a normal mood and affect.    ED Course  Procedures (including critical care time)  Labs Reviewed - No data to display No results found.  Dx 1: Pharyngitis Dx 2: Sinusitis   MDM  Patient with recent diagnosis of sinusitis. Clinically, there is evidence of a sinusitis and pharyngitis on her examination. The abdomen she was started on yesterday is sufficient for treatment of streptococcal pharyngitis, if she does have this-therefore, a culture of her throat is not performed today. I advised her to continue taking the antibiotic. I will  write her for a small amount of stronger pain medication. She is also given a dose of Decadron in the emergency department and ibuprofen for fever reduction. She is nontoxic-appearing and will be discharged home once her vital signs have improved.      6:47 PM Fever reduced, HR improved. Will d.c home.  Shaaron Adler, [place] 12/07/11 [phone]

## 2011-12-07 NOTE — Discharge Instructions (Signed)
As we discussed, you can use numbing throat lozenges such as cepacol or chloraseptic to help with your throat pain. You can use the prescribed pain medication as necessary for severe pain. Continue to take the antibiotic.      Pharyngitis, Viral and Bacterial Pharyngitis is soreness (inflammation) or infection of the pharynx. It is also called a sore throat. CAUSES  Most sore throats are caused by viruses and are part of a cold. However, some sore throats are caused by strep and other bacteria. Sore throats can also be caused by post nasal drip from draining sinuses, allergies and sometimes from sleeping with an open mouth. Infectious sore throats can be spread from person to person by coughing, sneezing and sharing cups or eating utensils. TREATMENT  Sore throats that are viral usually last 3-4 days. Viral illness will get better without medications (antibiotics). Strep throat and other bacterial infections will usually begin to get better about 24-48 hours after you begin to take antibiotics. HOME CARE INSTRUCTIONS   If the caregiver feels there is a bacterial infection or if there is a positive strep test, they will prescribe an antibiotic. The full course of antibiotics must be taken. If the full course of antibiotic is not taken, you or your child may become ill again. If you or your child has strep throat and do not finish all of the medication, serious heart or kidney diseases may develop.   Drink enough water and fluids to keep your urine clear or pale yellow.   Only take over-the-counter or prescription medicines for pain, discomfort or fever as directed by your caregiver.   Get lots of rest.   Gargle with salt water ( tsp. of salt in a glass of water) as often as every 1-2 hours as you need for comfort.   Hard candies may soothe the throat if individual is not at risk for choking. Throat sprays or lozenges may also be used.  SEEK MEDICAL CARE IF:   Large, tender lumps in the  neck develop.   A rash develops.   Green, yellow-brown or bloody sputum is coughed up.   Your baby is older than 3 months with a rectal temperature of 100.5 F (38.1 C) or higher for more than 1 day.  SEEK IMMEDIATE MEDICAL CARE IF:   A stiff neck develops.   You or your child are drooling or unable to swallow liquids.   You or your child are vomiting, unable to keep medications or liquids down.   You or your child has severe pain, unrelieved with recommended medications.   You or your child are having difficulty breathing (not due to stuffy nose).   You or your child are unable to fully open your mouth.   You or your child develop redness, swelling, or severe pain anywhere on the neck.   You have a fever.   Your baby is older than 3 months with a rectal temperature of 102 F (38.9 C) or higher.   Your baby is 1 months old or younger with a rectal temperature of 100.4 F (38 C) or higher.  MAKE SURE YOU:   Understand these instructions.   Will watch your condition.   Will get help right away if you are not doing well or get worse.  Document Released: 08/20/2005 Document Revised: 08/09/2011 Document Reviewed: 11/17/2007 [facility] [place] Patient Information 2012 [place], [place].         Sinusitis Sinuses are air pockets within the bones of your face. The growth  of bacteria within a sinus leads to infection. The infection prevents the sinuses from draining. This infection is called sinusitis. SYMPTOMS  There will be different areas of pain depending on which sinuses have become infected.  The maxillary sinuses often produce pain beneath the eyes.   Frontal sinusitis may cause pain in the middle of the forehead and above the eyes.  Other problems (symptoms) include:  Toothaches.   Colored, pus-like (purulent) drainage from the nose.   Swelling, warmth, and tenderness over the sinus areas may be signs of infection.  TREATMENT  Sinusitis is most often determined by  an exam.X-rays may be taken. If x-rays have been taken, make sure you obtain your results or find out how you are to obtain them. Your caregiver may give you medications (antibiotics). These are medications that will help kill the bacteria causing the infection. You may also be given a medication (decongestant) that helps to reduce sinus swelling.  HOME CARE INSTRUCTIONS   Only take over-the-counter or prescription medicines for pain, discomfort, or fever as directed by your caregiver.   Drink extra fluids. Fluids help thin the mucus so your sinuses can drain more easily.   Applying either moist heat or ice packs to the sinus areas may help relieve discomfort.   Use saline nasal sprays to help moisten your sinuses. The sprays can be found at your local drugstore.  SEEK IMMEDIATE MEDICAL CARE IF:  You have a fever.   You have increasing pain, severe headaches, or toothache.   You have nausea, vomiting, or drowsiness.   You develop unusual swelling around the face or trouble seeing.  MAKE SURE YOU:   Understand these instructions.   Will watch your condition.   Will get help right away if you are not doing well or get worse.  Document Released: 08/20/2005 Document Revised: 08/09/2011 Document Reviewed: 03/19/2007 [facility] Patient Information 2012 [place], [place].         Salt Water Gargle This solution will help make your mouth and throat feel better. HOME CARE INSTRUCTIONS   Mix 1 teaspoon of salt in 8 ounces of warm water.   Gargle with this solution as much or often as you need or as directed. Swish and gargle gently if you have any sores or wounds in your mouth.   Do not swallow this mixture.  Document Released: 05/24/2004 Document Revised: 08/09/2011 Document Reviewed: 10/15/2008 [facility] Patient Information 2012 [place], [place].

## 2011-12-08 NOTE — ED Provider Notes (Signed)
Medical screening examination/treatment/procedure(s) were performed by non-physician practitioner and as supervising physician I was immediately available for consultation/collaboration.  Doug Sou, MD 12/08/11 0005

## 2012-06-09 ENCOUNTER — Encounter: Payer: Self-pay | Admitting: Medical

## 2012-06-09 ENCOUNTER — Ambulatory Visit (INDEPENDENT_AMBULATORY_CARE_PROVIDER_SITE_OTHER): Payer: Managed Care, Other (non HMO) | Admitting: Medical

## 2012-06-09 VITALS — BP 120/80 | HR 97 | Temp 98.6°F | Resp 18 | Wt 240.0 lb

## 2012-06-09 DIAGNOSIS — J45901 Unspecified asthma with (acute) exacerbation: Secondary | ICD-10-CM

## 2012-06-09 MED ORDER — LEVOCETIRIZINE DIHYDROCHLORIDE 5 MG PO TABS: 5.0000 mg | ORAL_TABLET | Freq: Every evening | ORAL | Status: DC

## 2012-06-09 MED ORDER — METHYLPREDNISOLONE SODIUM SUCC 125 MG IJ SOLR
125.0000 mg | Freq: Once | INTRAMUSCULAR | Status: AC
  Administered 2012-06-09: 125 mg via INTRAMUSCULAR

## 2012-06-09 MED ORDER — ALBUTEROL SULFATE HFA 108 (90 BASE) MCG/ACT IN AERS: 2.0000 | INHALATION_SPRAY | Freq: Four times a day (QID) | RESPIRATORY_TRACT | Status: DC | PRN

## 2012-06-09 MED ORDER — BECLOMETHASONE DIPROPIONATE 80 MCG/ACT IN AERS: 2.0000 | INHALATION_SPRAY | Freq: Two times a day (BID) | RESPIRATORY_TRACT | Status: DC

## 2012-06-09 MED ORDER — ALBUTEROL SULFATE (2.5 MG/3ML) 0.083% IN NEBU: 2.5000 mg | INHALATION_SOLUTION | Freq: Four times a day (QID) | RESPIRATORY_TRACT | Status: DC | PRN

## 2012-06-09 NOTE — Patient Instructions (Signed)
Begin back on Qvar, 2 puffs twice daily to help CONTROL symptoms. Continue this for at least the next 1-2 months during the fall.    Use your albuterol inhaler, 2 puffs every 4-6 hours OR the albuterol nebulized solution as needed for wheezing.  This is your RESCUE inhaler.   Begin back on Xyzal prescription antihistamine or OTC Zyrtec for allergy triggers such as mold  Rest, drink plenty of fluids.   We gave you a shot of steroid today to help calm down current symptoms.    If not improving, then return.  otherwise recheck in 3-4 weeks on asthma.

## 2012-06-09 NOTE — Progress Notes (Signed)
Subjective: Here for about a month of difficulty breathing.  Has hx/o asthma.  She notes that she normally gets asthma flare up during season changes in spring and fall and through other triggers such as mold and dust.  In general she has been having cough and wheezing intermittent for a month but worse this past weekend.  Using her nebulizer since she is out of inhaler and Qvar.  She moved out of her home to a new place on 05/31/12 due to mold issues.  This also stirred up dust.  Denies fever, sore throat, congestion, sneezing, itchy nose, nausea, vomiting.   Past Medical History  Diagnosis Date  . Allergy   . Asthma   . IUD (intrauterine device) in place    ROS as in HPI     Objective:   Physical Exam  Filed Vitals:   06/09/12 1615  BP: 120/80  Pulse: 97  Temp: 98.6 F (37 C)  Resp: 18    General appearance: alert, no distress, WD/WN  HEENT: normocephalic, sclerae anicteric, TMs pearly, nares patent, no discharge or erythema, pharynx normal Oral cavity: MMM, no lesions Neck: supple, no lymphadenopathy, no thyromegaly, no masses Heart: RRR, normal S1, S2, no murmurs Lungs:diffuse wheezes, no rhonchi or rales Pulses normal Ext: no edema or cyanosis   Assessment and Plan :    Encounter Diagnosis  Name Primary?  Marland Kitchen Asthma exacerbation Yes     98% room air pulse on presentation but quite wheezy.   1 round of albuterol nebulized in office.  discussed her symptoms, concerns, and prior medication regimen.  Solumedrol 125mg  IM given in office.  Scripts to restart Qvar, albuterol HFA or nebulized prn q4-6 hours, restart zyrtec, and recheck in 3-4 wk, sooner prn.

## 2012-06-30 ENCOUNTER — Encounter: Payer: Self-pay | Admitting: Medical

## 2012-06-30 ENCOUNTER — Ambulatory Visit (INDEPENDENT_AMBULATORY_CARE_PROVIDER_SITE_OTHER): Payer: Managed Care, Other (non HMO) | Admitting: Medical

## 2012-06-30 VITALS — BP 108/80 | HR 80 | Temp 98.2°F | Resp 16 | Wt 240.0 lb

## 2012-06-30 DIAGNOSIS — J40 Bronchitis, not specified as acute or chronic: Secondary | ICD-10-CM

## 2012-06-30 DIAGNOSIS — E669 Obesity, unspecified: Secondary | ICD-10-CM

## 2012-06-30 DIAGNOSIS — J45901 Unspecified asthma with (acute) exacerbation: Secondary | ICD-10-CM

## 2012-06-30 MED ORDER — AZITHROMYCIN 500 MG PO TABS: 500.0000 mg | ORAL_TABLET | Freq: Every day | ORAL | Status: AC

## 2012-06-30 MED ORDER — BUDESONIDE-FORMOTEROL FUMARATE 160-4.5 MCG/ACT IN AERO: 2.0000 | INHALATION_SPRAY | Freq: Two times a day (BID) | RESPIRATORY_TRACT | Status: DC

## 2012-06-30 NOTE — Progress Notes (Signed)
Subjective: Here for recheck.  I saw her 06/09/12 for asthma exacerbation at which time we restarted zyrtec daily, Qvar BID and albuterol prn.   However, she has been using Albuterol BID along with Qvar every day.   Had been doing well the last few weeks until a week ago started getting sick.   Her kids have had a cold and she got it from them.  She notes in additional to asthma being flared up again, has cough, congestion, low grade fever, productive sputum x 1.5 wk.  She moved out of her home to a new place on 05/31/12 due to mold issues.    Past Medical History  Diagnosis Date  . Allergy   . Asthma   . IUD (intrauterine device) in place    ROS as in HPI    Objective:   Physical Exam  Filed Vitals:   06/30/12 1014  BP: 108/80  Pulse: 80  Temp: 98.2 F (36.8 C)  Resp: 16    General appearance: alert, no distress, WD/WN  HEENT: normocephalic, sclerae anicteric, TMs pearly, nares with purulent discharge and mild erythema, pharynx normal Oral cavity: MMM, no lesions Neck: supple, no lymphadenopathy, no thyromegaly, no masses Heart: RRR, normal S1, S2, no murmurs Lungs: scattered wheezes, + rhonchi or rales Pulses normal Ext: no edema or cyanosis   Assessment and Plan :    Encounter Diagnoses  Name Primary?  Marland Kitchen Asthma exacerbation Yes  . Bronchitis   . Obesity    Pulse ox 98%, pulse 85bpm.   Discussed symptoms, treatment, and will change to Symbicort for better control, change albuterol to prn, not BID use, c/t zyrtec, rest, hydrate well, and call if not improving in 1 wk.  In general for asthma control, c/t Symbicort until about mid November.  Advised similar controller regimen in spring and fall.  Usually not symptomatic in the summer.     Obesity - she is begin followed by weight loss clinic.  Getting B12 shots monthly, will be starting HCG shots, and oral medication.

## 2012-06-30 NOTE — Addendum Note (Signed)
Addended by: Jac Canavan on: 06/30/2012 08:38 PM   Modules accepted: Orders

## 2012-06-30 NOTE — Patient Instructions (Addendum)
Stop qvar.  Begin Symbicort 2 puffs twice daily for prevention.   You can continue Albuterol rescue inhaler, 2 puffs every 4-6 hours as need for wheezing/tightness.     Begin Azithromycin antibiotic, 1 tablet daily for 3 days.  Drink plenty of water daily.   Rest accordingly.    As things improved, continue Symbicort for the next 2-4 weeks.   If things seem stable then, you can try going to once daily with Symbicort.  Call or return in 1-2 months depending on symptoms.    I would try to use a preventative regimen of allergy medication such as zyrtec, and preventative asthma medication such as Symbicort prior to spring and fall seasons yearly.

## 2012-09-21 ENCOUNTER — Encounter (HOSPITAL_COMMUNITY): Payer: Self-pay

## 2012-09-21 ENCOUNTER — Inpatient Hospital Stay (HOSPITAL_COMMUNITY)
Admission: EM | Admit: 2012-09-21 | Discharge: 2012-09-23 | DRG: 189 | Disposition: A | Discharge status: Home / Self Care | Payer: 59 | Attending: Internal Medicine | Admitting: Internal Medicine

## 2012-09-21 ENCOUNTER — Emergency Department (HOSPITAL_COMMUNITY): Payer: 59

## 2012-09-21 DIAGNOSIS — Z23 Encounter for immunization: Secondary | ICD-10-CM

## 2012-09-21 DIAGNOSIS — J45909 Unspecified asthma, uncomplicated: Secondary | ICD-10-CM

## 2012-09-21 DIAGNOSIS — E669 Obesity, unspecified: Secondary | ICD-10-CM

## 2012-09-21 DIAGNOSIS — E876 Hypokalemia: Secondary | ICD-10-CM

## 2012-09-21 DIAGNOSIS — Z6841 Body Mass Index (BMI) 40.0 and over, adult: Secondary | ICD-10-CM

## 2012-09-21 DIAGNOSIS — J9600 Acute respiratory failure, unspecified whether with hypoxia or hypercapnia: Principal | ICD-10-CM

## 2012-09-21 DIAGNOSIS — R000 Tachycardia, unspecified: Secondary | ICD-10-CM | POA: Diagnosis present

## 2012-09-21 DIAGNOSIS — J209 Acute bronchitis, unspecified: Secondary | ICD-10-CM

## 2012-09-21 DIAGNOSIS — R0902 Hypoxemia: Secondary | ICD-10-CM

## 2012-09-21 DIAGNOSIS — J45901 Unspecified asthma with (acute) exacerbation: Secondary | ICD-10-CM

## 2012-09-21 LAB — CBC WITH DIFFERENTIAL/PLATELET
Hemoglobin: 12.9 g/dL (ref 12.0–15.0)
Lymphs Abs: 2.6 10*3/uL (ref 0.7–4.0)
Monocytes Relative: 8 % (ref 3–12)
Neutro Abs: 4.2 10*3/uL (ref 1.7–7.7)
Neutrophils Relative %: 51 % (ref 43–77)
Platelets: 252 10*3/uL (ref 150–400)
RBC: 4.62 MIL/uL (ref 3.87–5.11)
WBC: 8.3 10*3/uL (ref 4.0–10.5)

## 2012-09-21 LAB — BASIC METABOLIC PANEL
BUN: 11 mg/dL (ref 6–23)
Chloride: 106 mEq/L (ref 96–112)
GFR calc Af Amer: >90 mL/min (ref >90)
Glucose, Bld: 105 mg/dL — ABNORMAL HIGH (ref 70–99)
Potassium: 3.4 mEq/L — ABNORMAL LOW (ref 3.5–5.1)
Sodium: 139 mEq/L (ref 135–145)

## 2012-09-21 LAB — URINALYSIS, ROUTINE W REFLEX MICROSCOPIC
Bilirubin Urine: NEGATIVE
Hgb urine dipstick: NEGATIVE
Nitrite: NEGATIVE
Specific Gravity, Urine: 1.024 (ref 1.005–1.030)
Urobilinogen, UA: 1 mg/dL (ref 0.0–1.0)
pH: 5.5 (ref 5.0–8.0)

## 2012-09-21 LAB — PREGNANCY, URINE: Preg Test, Ur: NEGATIVE

## 2012-09-21 MED ORDER — ALBUTEROL (5 MG/ML) CONTINUOUS INHALATION SOLN
10.0000 mg/h | INHALATION_SOLUTION | RESPIRATORY_TRACT | Status: AC
  Administered 2012-09-21: 10 mg/h via RESPIRATORY_TRACT
  Filled 2012-09-21: qty 20

## 2012-09-21 MED ORDER — ALBUTEROL SULFATE (5 MG/ML) 0.5% IN NEBU
5.0000 mg | INHALATION_SOLUTION | Freq: Once | RESPIRATORY_TRACT | Status: AC
  Administered 2012-09-21: 5 mg via RESPIRATORY_TRACT
  Filled 2012-09-21: qty 1

## 2012-09-21 MED ORDER — PREDNISONE 20 MG PO TABS: ORAL_TABLET | ORAL | Status: DC

## 2012-09-21 MED ORDER — AZITHROMYCIN 1 G PO PACK: 1.0000 | PACK | Freq: Once | ORAL | Status: DC

## 2012-09-21 MED ORDER — IPRATROPIUM BROMIDE 0.02 % IN SOLN
0.5000 mg | Freq: Once | RESPIRATORY_TRACT | Status: AC
  Administered 2012-09-21: 0.5 mg via RESPIRATORY_TRACT
  Filled 2012-09-21: qty 2.5

## 2012-09-21 MED ORDER — ALBUTEROL SULFATE HFA 108 (90 BASE) MCG/ACT IN AERS: 1.0000 | INHALATION_SPRAY | Freq: Four times a day (QID) | RESPIRATORY_TRACT | Status: DC | PRN

## 2012-09-21 NOTE — ED Provider Notes (Signed)
History     CSN: [id number]  Arrival date & time 09/21/12  Sherry Ferguson   First MD Initiated Contact with Patient 09/21/12 1851      Chief Complaint  Patient presents with  . Shortness of Breath    (Consider location/radiation/quality/duration/timing/severity/associated sxs/prior treatment) HPI Comments: 27 y.o. Female PMHx of asthma presents with gradual worsening asthma for the past week and severe exacerbation last night possibly brought on by concomitant upper respiratory infection and exposure to mold. Pt tried at home inhalers and nebulizer with no relief. Dyspnea is worse on exertion, but not much better at rest. Pt describes severity as 10/10. Pt can barely speak in complete sentences.  Patient is a 27 y.o. female presenting with shortness of breath.  Shortness of Breath  Associated symptoms include shortness of breath and wheezing. Pertinent negatives include no chest pain and no fever.    Past Medical History  Diagnosis Date  . Allergy   . Asthma   . IUD (intrauterine device) in place     History reviewed. No pertinent past surgical history.  Family History  Problem Relation Age of Onset  . Asthma Mother   . Diabetes Father   . Hyperlipidemia Father   . Hypertension Father     History  Substance Use Topics  . Smoking status: Former Smoker    Types: Cigarettes    Quit date: 09/04/2007  . Smokeless tobacco: Never Used  . Alcohol Use: No    OB History    Grav Para Term Preterm Abortions TAB SAB Ect Mult Living                  Review of Systems  Constitutional: Negative for fever and diaphoresis.  HENT: Negative for neck pain and neck stiffness.   Eyes: Negative for visual disturbance.  Respiratory: Positive for chest tightness, shortness of breath and wheezing. Negative for apnea.   Cardiovascular: Negative for chest pain and palpitations.  Gastrointestinal: Negative for nausea, vomiting, diarrhea and constipation.  Genitourinary: Negative for dysuria.    Musculoskeletal: Negative for gait problem.  Skin: Negative for rash.  Neurological: Negative for dizziness, weakness, light-headedness, numbness and headaches.  All other systems reviewed and are negative.    Allergies  Review of patient's allergies indicates no known allergies.  Home Medications   Current Outpatient Rx  Name  Route  Sig  Dispense  Refill  . ALBUTEROL SULFATE HFA 108 (90 BASE) MCG/ACT IN AERS   Inhalation   Inhale 2 puffs into the lungs every 6 (six) hours as needed. For shortness of breath and wheezing   1 Inhaler   1   . ALBUTEROL SULFATE (2.5 MG/3ML) 0.083% IN NEBU   Nebulization   Take 3 mLs (2.5 mg total) by nebulization every 6 (six) hours as needed for wheezing.   75 mL   3   . OMEGA-3 FATTY ACIDS 1000 MG PO CAPS   Oral   Take 2 g by mouth daily.         . IBUPROFEN 600 MG PO TABS   Oral   Take 1 tablet (600 mg total) by mouth every 6 (six) hours as needed for fever. For pain   30 tablet   0   . LEVONORGESTREL 20 MCG/24HR IU IUD   Intrauterine   1 each by Intrauterine route once.           . ADULT MULTIVITAMIN W/MINERALS CH   Oral   Take 1 tablet by mouth daily.  BP 114/93  Pulse 117  Temp 97.9 F (36.6 C) (Oral)  SpO2 96%  LMP 09/21/2012  Physical Exam  Nursing note and vitals reviewed. Constitutional: She is oriented to person, place, and time. She appears well-developed and well-nourished. No distress.  HENT:  Head: Normocephalic and atraumatic.  Eyes: EOM are normal. Pupils are equal, round, and reactive to light.  Neck: Normal range of motion. Neck supple.       No meningeal signs  Cardiovascular: Normal rate, regular rhythm and normal heart sounds.  Exam reveals no gallop and no friction rub.   No murmur heard. Pulmonary/Chest: She is in respiratory distress. She has wheezes. She has no rales. She exhibits no tenderness.       Although saturation level is good, pt seems laboring to breathe. Wheezes audible  without auscultation.  Abdominal: Soft. Bowel sounds are normal. She exhibits no distension. There is no tenderness. There is no rebound and no guarding.  Musculoskeletal: Normal range of motion. She exhibits no edema and no tenderness.  Neurological: She is alert and oriented to person, place, and time. No cranial nerve deficit.  Skin: Skin is warm and dry. She is not diaphoretic. No erythema.    ED Course  Procedures (including critical care time)  Labs Reviewed - No data to display Dg Chest 2 View  09/21/2012  *RADIOLOGY REPORT*  Clinical Data: Shortness of breath.  History of asthma.  CHEST - 2 VIEW  Comparison: 05/16/2011.  Findings: The cardiac silhouette, mediastinal and hilar contours are within normal limits and stable. The lungs are clear.  No pleural effusions.  The bony thorax is intact.  IMPRESSION: No acute cardiopulmonary findings.   Original Report Authenticated By: Rudie Meyer, M.D.     Results for orders placed during the hospital encounter of 09/21/12  CBC WITH DIFFERENTIAL      Component Value Range   WBC 8.3  4.0 - 10.5 K/uL   RBC 4.62  3.87 - 5.11 MIL/uL   Hemoglobin 12.9  12.0 - 15.0 g/dL   HCT 40.9  81.1 - 91.4 %   MCV 82.9  78.0 - 100.0 fL   MCH 27.9  26.0 - 34.0 pg   MCHC 33.7  30.0 - 36.0 g/dL   RDW 78.2  95.6 - 21.3 %   Platelets 252  150 - 400 K/uL   Neutrophils Relative 51  43 - 77 %   Neutro Abs 4.2  1.7 - 7.7 K/uL   Lymphocytes Relative 32  12 - 46 %   Lymphs Abs 2.6  0.7 - 4.0 K/uL   Monocytes Relative 8  3 - 12 %   Monocytes Absolute 0.7  0.1 - 1.0 K/uL   Eosinophils Relative 9 (*) 0 - 5 %   Eosinophils Absolute 0.7  0.0 - 0.7 K/uL   Basophils Relative 0  0 - 1 %   Basophils Absolute 0.0  0.0 - 0.1 K/uL  BASIC METABOLIC PANEL      Component Value Range   Sodium 139  135 - 145 mEq/L   Potassium 3.4 (*) 3.5 - 5.1 mEq/L   Chloride 106  96 - 112 mEq/L   CO2 24  19 - 32 mEq/L   Glucose, Bld 105 (*) 70 - 99 mg/dL   BUN 11  6 - 23 mg/dL    Creatinine, Ser 0.86  0.50 - 1.10 mg/dL   Calcium 9.0  8.4 - 57.8 mg/dL   GFR calc non Af Amer >90  >90  mL/min   GFR calc Af Amer >90  >90 mL/min  URINALYSIS, ROUTINE W REFLEX MICROSCOPIC      Component Value Range   Color, Urine YELLOW  YELLOW   APPearance HAZY (*) CLEAR   Specific Gravity, Urine 1.024  1.005 - 1.030   pH 5.5  5.0 - 8.0   Glucose, UA NEGATIVE  NEGATIVE mg/dL   Hgb urine dipstick NEGATIVE  NEGATIVE   Bilirubin Urine NEGATIVE  NEGATIVE   Ketones, ur NEGATIVE  NEGATIVE mg/dL   Protein, ur NEGATIVE  NEGATIVE mg/dL   Urobilinogen, UA 1.0  0.0 - 1.0 mg/dL   Nitrite NEGATIVE  NEGATIVE   Leukocytes, UA NEGATIVE  NEGATIVE  PREGNANCY, URINE      Component Value Range   Preg Test, Ur NEGATIVE  NEGATIVE   Acute exacerbation of asthma    MDM  Pt was given one nebulizer treatment, one continuous treatment. Respirations improved and pt was able to speak in complete sentences. Wheezing had subsided. Discharge was discussed. Pt got up to ambulate and became extremely short of breath. Re-evaluated pt for admission as wheezing cannot be controlled in ED. Third treatment stopped due to tachycardia to the 140's. Pt will be admitted.  Glade Nurse, PA-C 09/22/12 [phone]

## 2012-09-21 NOTE — ED Notes (Signed)
Patient transported to X-ray 

## 2012-09-21 NOTE — Progress Notes (Signed)
Pt with gradually worsening asthma over a week, with wheezing and cough, no relief with albuterol inhaler and nebulizer machine.  She has wheezing, and has not improved with albuterol nebulizer treatment, steroids, and a dose of azithromycin.  We tried to discharge her, but she became tight again.  Call to Dr. Arthor Captain --> admit to observation status to a med surg bed.

## 2012-09-21 NOTE — Progress Notes (Signed)
Mid tx- pt HR increased to 140. Tx stopped at this time. Pt HR currently maintaining at 128. RN aware.

## 2012-09-21 NOTE — ED Notes (Signed)
Pt states she has been using her inhalers and nebulizers with no relief.  Pt reports increasing SOB beginning last night.  Pt alert and oriented.

## 2012-09-21 NOTE — ED Notes (Signed)
Patient is resting comfortably. 

## 2012-09-21 NOTE — ED Notes (Signed)
Called respiratory to administer breathing treatments.

## 2012-09-22 ENCOUNTER — Encounter (HOSPITAL_COMMUNITY): Payer: Self-pay | Admitting: *Deleted

## 2012-09-22 DIAGNOSIS — J209 Acute bronchitis, unspecified: Secondary | ICD-10-CM | POA: Diagnosis present

## 2012-09-22 DIAGNOSIS — E669 Obesity, unspecified: Secondary | ICD-10-CM

## 2012-09-22 DIAGNOSIS — E876 Hypokalemia: Secondary | ICD-10-CM | POA: Diagnosis present

## 2012-09-22 DIAGNOSIS — R0902 Hypoxemia: Secondary | ICD-10-CM

## 2012-09-22 DIAGNOSIS — J9600 Acute respiratory failure, unspecified whether with hypoxia or hypercapnia: Principal | ICD-10-CM | POA: Diagnosis present

## 2012-09-22 DIAGNOSIS — J45901 Unspecified asthma with (acute) exacerbation: Secondary | ICD-10-CM

## 2012-09-22 LAB — BASIC METABOLIC PANEL
BUN: 10 mg/dL (ref 6–23)
Calcium: 8.8 mg/dL (ref 8.4–10.5)
Chloride: 107 mEq/L (ref 96–112)
Creatinine, Ser: 0.62 mg/dL (ref 0.50–1.10)
GFR calc Af Amer: >90 mL/min (ref >90)

## 2012-09-22 MED ORDER — GUAIFENESIN ER 600 MG PO TB12
600.0000 mg | ORAL_TABLET | Freq: Two times a day (BID) | ORAL | Status: DC
  Administered 2012-09-22 (×2): 600 mg via ORAL
  Filled 2012-09-22 (×3): qty 1

## 2012-09-22 MED ORDER — POTASSIUM CHLORIDE CRYS ER 20 MEQ PO TBCR
60.0000 meq | EXTENDED_RELEASE_TABLET | Freq: Once | ORAL | Status: AC
  Administered 2012-09-22: 60 meq via ORAL
  Filled 2012-09-22: qty 3

## 2012-09-22 MED ORDER — AZITHROMYCIN 250 MG PO TABS: 250.0000 mg | ORAL_TABLET | Freq: Every day | ORAL | Status: DC

## 2012-09-22 MED ORDER — SODIUM CHLORIDE 0.9 % IV SOLN
INTRAVENOUS | Status: DC
  Administered 2012-09-22: 01:00:00 via INTRAVENOUS

## 2012-09-22 MED ORDER — ACETAMINOPHEN 325 MG PO TABS
650.0000 mg | ORAL_TABLET | Freq: Four times a day (QID) | ORAL | Status: DC | PRN
  Filled 2012-09-22: qty 2

## 2012-09-22 MED ORDER — AZITHROMYCIN 500 MG PO TABS
500.0000 mg | ORAL_TABLET | Freq: Once | ORAL | Status: AC
  Administered 2012-09-22: 500 mg via ORAL
  Filled 2012-09-22: qty 1

## 2012-09-22 MED ORDER — GUAIFENESIN-DM 100-10 MG/5ML PO SYRP
5.0000 mL | ORAL_SOLUTION | ORAL | Status: DC | PRN
  Administered 2012-09-22: 5 mL via ORAL
  Filled 2012-09-22 (×2): qty 5

## 2012-09-22 MED ORDER — ALBUTEROL SULFATE (5 MG/ML) 0.5% IN NEBU
2.5000 mg | INHALATION_SOLUTION | Freq: Four times a day (QID) | RESPIRATORY_TRACT | Status: DC
  Administered 2012-09-22 – 2012-09-23 (×7): 2.5 mg via RESPIRATORY_TRACT
  Filled 2012-09-22 (×7): qty 0.5

## 2012-09-22 MED ORDER — LEVOFLOXACIN IN D5W 500 MG/100ML IV SOLN
500.0000 mg | INTRAVENOUS | Status: DC
  Administered 2012-09-22 – 2012-09-23 (×2): 500 mg via INTRAVENOUS
  Filled 2012-09-22 (×2): qty 100

## 2012-09-22 MED ORDER — IPRATROPIUM BROMIDE 0.02 % IN SOLN
0.5000 mg | Freq: Four times a day (QID) | RESPIRATORY_TRACT | Status: DC
  Administered 2012-09-22 (×2): 0.5 mg via RESPIRATORY_TRACT
  Filled 2012-09-22 (×2): qty 2.5

## 2012-09-22 MED ORDER — ACETAMINOPHEN 650 MG RE SUPP: 650.0000 mg | Freq: Four times a day (QID) | RECTAL | Status: DC | PRN

## 2012-09-22 MED ORDER — ONDANSETRON HCL 4 MG PO TABS: 4.0000 mg | ORAL_TABLET | Freq: Four times a day (QID) | ORAL | Status: DC | PRN

## 2012-09-22 MED ORDER — MAGNESIUM SULFATE 40 MG/ML IJ SOLN
2.0000 g | Freq: Once | INTRAMUSCULAR | Status: AC
  Administered 2012-09-22: 2 g via INTRAVENOUS
  Filled 2012-09-22: qty 50

## 2012-09-22 MED ORDER — HEPARIN SODIUM (PORCINE) 5000 UNIT/ML IJ SOLN
5000.0000 [IU] | Freq: Three times a day (TID) | INTRAMUSCULAR | Status: DC
  Administered 2012-09-22 (×3): 5000 [IU] via SUBCUTANEOUS
  Filled 2012-09-22 (×7): qty 1

## 2012-09-22 MED ORDER — METHYLPREDNISOLONE SODIUM SUCC 125 MG IJ SOLR
80.0000 mg | Freq: Three times a day (TID) | INTRAMUSCULAR | Status: DC
  Administered 2012-09-22 (×3): 80 mg via INTRAVENOUS
  Filled 2012-09-22 (×7): qty 1.28

## 2012-09-22 MED ORDER — SODIUM CHLORIDE 0.9 % IJ SOLN
3.0000 mL | Freq: Two times a day (BID) | INTRAMUSCULAR | Status: DC
  Administered 2012-09-22: 10 mL via INTRAVENOUS
  Administered 2012-09-22 – 2012-09-23 (×3): 3 mL via INTRAVENOUS

## 2012-09-22 MED ORDER — ONDANSETRON HCL 4 MG/2ML IJ SOLN: 4.0000 mg | Freq: Four times a day (QID) | INTRAMUSCULAR | Status: DC | PRN

## 2012-09-22 MED ORDER — ALBUTEROL SULFATE (5 MG/ML) 0.5% IN NEBU
2.5000 mg | INHALATION_SOLUTION | RESPIRATORY_TRACT | Status: DC | PRN
  Filled 2012-09-22: qty 0.5

## 2012-09-22 MED ORDER — OXYCODONE HCL 5 MG PO TABS: 5.0000 mg | ORAL_TABLET | ORAL | Status: DC | PRN

## 2012-09-22 MED ORDER — PNEUMOCOCCAL VAC POLYVALENT 25 MCG/0.5ML IJ INJ
0.5000 mL | INJECTION | INTRAMUSCULAR | Status: AC
  Administered 2012-09-23: 0.5 mL via INTRAMUSCULAR
  Filled 2012-09-22: qty 0.5

## 2012-09-22 NOTE — ED Provider Notes (Signed)
Medical screening examination/treatment/procedure(s) were conducted as a shared visit with non-physician practitioner(s) and myself.  I personally evaluated the patient during the encounter Pt with gradually worsening asthma over a week, with wheezing and cough, no relief with albuterol inhaler and nebulizer machine. She has wheezing, and has not improved with albuterol nebulizer treatment, steroids, and a dose of azithromycin. We tried to discharge her, but she became tight again. Call to Dr. Arthor Captain --> admit to observation status to a med surg bed.       Carleene Cooper III, MD 09/22/12 1224

## 2012-09-22 NOTE — Progress Notes (Signed)
[facility] PROGRESS NOTE  Sherry Ferguson ZOX:[id number] DOB: [birth year] DOA: 09/21/2012 PCP: Carollee Herter, MD  Assessment/Plan: Acute asthma exacerbation with acute hypoxic respiratory failure   -No history of intubation/mechanical ventilation  -Continue albuterol inhalers, Started on iv steroids on admission - continued today  - continue to have severe wheezing and hypoxia - 09/22/2012  - fluter valve  - continue oxygen  - start iv abx - suspect bacterial infectious process the reason why it is so resistant to treatment   Obesity  -Counseled extensively about weight loss      Code Status: full  Family Communication: patient  Disposition Plan: home    HPI/Subjective: Still with severe dyspnea and wheezing   Objective: Filed Vitals:   09/22/12 0121 09/22/12 0159 09/22/12 0226 09/22/12 0459  BP: 106/75 122/72  109/59  Pulse: 123 116  117  Temp:  97.3 F (36.3 C)  97.3 F (36.3 C)  TempSrc:  Oral  Oral  Resp:  20  20  Height:  5\' 5"  (1.651 m)    Weight:  109.317 kg (241 lb)    SpO2: 99% 98% 98% 99%    Intake/Output Summary (Last 24 hours) at 09/22/12 1029 Last data filed at 09/22/12 0600  Gross per 24 hour  Intake 844.25 ml  Output      0 ml  Net 844.25 ml   Filed Weights   09/22/12 0159  Weight: 109.317 kg (241 lb)    Exam:   General:  axox3  Cardiovascular: tachy, reg  Respiratory: bilat widespread rhonchi and wheezes, dry cough   Abdomen: soft, nt   Data Reviewed: Basic Metabolic Panel:  Lab 09/22/12 8119 09/21/12 2239  NA 138 139  K 4.3 3.4*  CL 107 106  CO2 23 24  GLUCOSE 91 105*  BUN 10 11  CREATININE 0.62 0.63  CALCIUM 8.8 9.0  MG -- --  PHOS -- --   Liver Function Tests: No results found for this basename: AST:5,ALT:5,ALKPHOS:5,BILITOT:5,PROT:5,ALBUMIN:5 in the last 168 hours No results found for this basename: LIPASE:5,AMYLASE:5 in the last 168 hours No results found for this basename: AMMONIA:5 in the last  168 hours CBC:  Lab 09/21/12 2239  WBC 8.3  NEUTROABS 4.2  HGB 12.9  HCT 38.3  MCV 82.9  PLT 252   Cardiac Enzymes: No results found for this basename: CKTOTAL:5,CKMB:5,CKMBINDEX:5,TROPONINI:5 in the last 168 hours BNP (last 3 results) No results found for this basename: PROBNP:3 in the last 8760 hours CBG: No results found for this basename: GLUCAP:5 in the last 168 hours  No results found for this or any previous visit (from the past 240 hour(s)).   Studies: Dg Chest 2 View  09/21/2012  *RADIOLOGY REPORT*  Clinical Data: Shortness of breath.  History of asthma.  CHEST - 2 VIEW  Comparison: 05/16/2011.  Findings: The cardiac silhouette, mediastinal and hilar contours are within normal limits and stable. The lungs are clear.  No pleural effusions.  The bony thorax is intact.  IMPRESSION: No acute cardiopulmonary findings.   Original Report Authenticated By: Rudie Meyer, M.D.     Scheduled Meds:    . albuterol  2.5 mg Nebulization Q6H  . heparin  5,000 Units Subcutaneous Q8H  . levofloxacin (LEVAQUIN) IV  500 mg Intravenous Q24H  . methylPREDNISolone (SOLU-MEDROL) injection  80 mg Intravenous Q8H  . pneumococcal 23 valent vaccine  0.5 mL Intramuscular Tomorrow-1000  . sodium chloride  3 mL Intravenous Q12H   Continuous Infusions:   Principal Problem:  *  Acute respiratory failure Active Problems:  Obesity (BMI 30-39.9)  Asthma with allergic rhinitis  Asthma with acute exacerbation  Acute bronchitis  Hypokalemia    Sherry Ferguson  [facility] Pager [phone]. If 8PM-8AM, please contact night-coverage at [email or web address], password [facility] 09/22/2012, 10:29 AM  LOS: 1 day

## 2012-09-22 NOTE — Progress Notes (Deleted)
Patient admitted to 6702 with exacerbation of her COPD. Patient alert and oriented x 4. Husband at bedside.  Patient continues to have SOB on exertion. Expiratory wheezing. Oxygen at 3LPM via [place].

## 2012-09-22 NOTE — ED Notes (Signed)
Family at bedside. 

## 2012-09-22 NOTE — H&P (Signed)
[facility] History and Physical  SHULAMIT DONOFRIO EAV:[id number] DOB: 11/29/1985 DOA: 09/21/2012  Referring physician: Carleene Cooper III, MD PCP: Carollee Herter, MD   Chief Complaint: Asthma exacerbation  HPI: Sherry Ferguson is a 27 y.o. female with no significant past medical history apart from asthma. Patient given to the hospital because of asthma exacerbation. Patient said about a week ago she attended a party in to her friend's basement. She thought she probably had some exposure to mold. Since then she complaining about shortness of breath wheezing which she got progressively worse. Today she came in to the hospital for further evaluation, initial evaluation in the emergency department showed clear chest x-ray, she denies any fever or chills. She had treatment with albuterol nebulizers, she was about to be discharged home but she has desaturation and continued to have severe wheezing. Patient noted to the hospital for further evaluation.   Review of Systems:  Constitutional: negative for anorexia, fevers and sweats Eyes: negative for irritation, redness and visual disturbance Ears, nose, mouth, throat, and face: negative for earaches, epistaxis, nasal congestion and sore throat Respiratory: per HPI Cardiovascular: negative for chest pain, dyspnea, lower extremity edema, orthopnea, palpitations and syncope Gastrointestinal: negative for abdominal pain, constipation, diarrhea, melena, nausea and vomiting Genitourinary:negative for dysuria, frequency and hematuria Hematologic/lymphatic: negative for bleeding, easy bruising and lymphadenopathy Musculoskeletal:negative for arthralgias, muscle weakness and stiff joints Neurological: negative for coordination problems, gait problems, headaches and weakness Endocrine: negative for diabetic symptoms including polydipsia, polyuria and weight loss Allergic/Immunologic: negative for anaphylaxis, hay fever and urticaria   Past  Medical History  Diagnosis Date  . Allergy   . Asthma   . IUD (intrauterine device) in place    History reviewed. No pertinent past surgical history. Social History:  reports that she quit smoking about 5 years ago. Her smoking use included Cigarettes. She has never used smokeless tobacco. She reports that she does not drink alcohol or use illicit drugs.  No Known Allergies  Family History  Problem Relation Age of Onset  . Asthma Mother   . Diabetes Father   . Hyperlipidemia Father   . Hypertension Father     Prior to Admission medications   Medication Sig Start Date End Date Taking? Authorizing Provider  albuterol (PROVENTIL HFA;VENTOLIN HFA) 108 (90 BASE) MCG/ACT inhaler Inhale 2 puffs into the lungs every 6 (six) hours as needed. For shortness of breath and wheezing 06/09/12  Yes Kermit Balo Tysinger, PA  albuterol (PROVENTIL) (2.5 MG/3ML) 0.083% nebulizer solution Take 3 mLs (2.5 mg total) by nebulization every 6 (six) hours as needed for wheezing. 06/09/12  Yes Kermit Balo Tysinger, PA  fish oil-omega-3 fatty acids 1000 MG capsule Take 2 g by mouth daily.   Yes Historical Provider, MD  ibuprofen (ADVIL,MOTRIN) 600 MG tablet Take 1 tablet (600 mg total) by mouth every 6 (six) hours as needed for fever. For pain 12/07/11  Yes Shaaron Adler, PA-C  levonorgestrel [facility]) 20 MCG/24HR IUD 1 each by Intrauterine route once.     Yes Historical Provider, MD  Multiple Vitamin (MULITIVITAMIN WITH MINERALS) TABS Take 1 tablet by mouth daily.   Yes Historical Provider, MD  albuterol (PROVENTIL HFA;VENTOLIN HFA) 108 (90 BASE) MCG/ACT inhaler Inhale 1-2 puffs into the lungs every 6 (six) hours as needed for wheezing. 09/21/12   Glade Nurse, PA-C  azithromycin [facility]) 1 G powder Take 1 packet by mouth once. 09/21/12   Glade Nurse, PA-C  predniSONE (DELTASONE) 20 MG tablet Take 20  mg by mouth daily three per day for 2 days, then two per day for 2 days, then one per day for 2 days. 09/21/12    Glade Nurse, PA-C   Physical Exam: Filed Vitals:   09/21/12 2215 09/21/12 2230 09/21/12 2245 09/21/12 2347  BP:      Pulse: 130 114 130   Temp:      TempSrc:      SpO2: 96% 100% 97% 100%   General appearance: alert, cooperative and no distress  Head: Normocephalic, without obvious abnormality, atraumatic  Eyes: conjunctivae/corneas clear. PERRL, EOM's intact. Fundi benign.  Nose: Nares normal. Septum midline. Mucosa normal. No drainage or sinus tenderness.  Throat: lips, mucosa, and tongue normal; teeth and gums normal  Neck: Supple, no masses, no cervical lymphadenopathy, no JVD appreciated, no meningeal signs Resp: Expiratory wheezes bilaterally. Chest wall: no tenderness  Cardio: regular rate and rhythm, S1, S2 normal, no murmur, click, rub or gallop  GI: soft, non-tender; bowel sounds normal; no masses, no organomegaly  Extremities: extremities normal, atraumatic, no cyanosis or edema  Skin: Skin color, texture, turgor normal. No rashes or lesions  Neurologic: Alert and oriented X 3, normal strength and tone. Normal symmetric reflexes. Normal coordination and gait   Labs on Admission:  Basic Metabolic Panel:  Lab 09/21/12 8295  NA 139  K 3.4*  CL 106  CO2 24  GLUCOSE 105*  BUN 11  CREATININE 0.63  CALCIUM 9.0  MG --  PHOS --   Liver Function Tests: No results found for this basename: AST:5,ALT:5,ALKPHOS:5,BILITOT:5,PROT:5,ALBUMIN:5 in the last 168 hours No results found for this basename: LIPASE:5,AMYLASE:5 in the last 168 hours No results found for this basename: AMMONIA:5 in the last 168 hours CBC:  Lab 09/21/12 2239  WBC 8.3  NEUTROABS 4.2  HGB 12.9  HCT 38.3  MCV 82.9  PLT 252   Cardiac Enzymes: No results found for this basename: CKTOTAL:5,CKMB:5,CKMBINDEX:5,TROPONINI:5 in the last 168 hours  BNP (last 3 results) No results found for this basename: PROBNP:3 in the last 8760 hours CBG: No results found for this basename: GLUCAP:5 in the last 168  hours  Radiological Exams on Admission: Dg Chest 2 View  09/21/2012  *RADIOLOGY REPORT*  Clinical Data: Shortness of breath.  History of asthma.  CHEST - 2 VIEW  Comparison: 05/16/2011.  Findings: The cardiac silhouette, mediastinal and hilar contours are within normal limits and stable. The lungs are clear.  No pleural effusions.  The bony thorax is intact.  IMPRESSION: No acute cardiopulmonary findings.   Original Report Authenticated By: Rudie Meyer, M.D.     EKG: Independently reviewed.   Assessment/Plan Principal Problem:  *Asthma with acute exacerbation Active Problems:  Obesity (BMI 30-39.9)  Asthma with allergic rhinitis  Acute bronchitis  Hypoxia   Acute asthma exacerbation -Patient was being treated in the emergency department, was about to be discharged home but she was hypoxic. -No history of intubation/mechanical ventilation -Continue albuterol inhalers, and Atrovent inhaler. -One dose of 2 mg of IV magnesium. -Start systemic steroids, mucolytics and antitussives.  Hypoxia -Likely transient hypoxia secondary to acute asthma exacerbation. -Patient is on 2 L of oxygen, hopefully will wean her off of the oxygen in a.m.  Acute bronchitis -Started on antibiotics, Z-Pak. Patient mentioned production of yellow sputum  Tachycardia -This is likely secondary to administration of SABA. -Hypoxia also might be contributing to the tachycardia., Follow closely.  Obesity -Counseled extensively about weight loss  Code Status: Full code Family Communication: Discussed with patient  and her husband who is at bedside Disposition Plan: Observation, telemetry  Time spent: 70 minutes  [facility] Pager [phone]  If 7PM-7AM, please contact night-coverage [email or web address] Password [facility] 09/22/2012, 12:29 AM

## 2012-09-22 NOTE — Progress Notes (Signed)
Utilization review completed.  

## 2012-09-23 DIAGNOSIS — E876 Hypokalemia: Secondary | ICD-10-CM

## 2012-09-23 DIAGNOSIS — J9600 Acute respiratory failure, unspecified whether with hypoxia or hypercapnia: Principal | ICD-10-CM

## 2012-09-23 MED ORDER — BUDESONIDE-FORMOTEROL FUMARATE 160-4.5 MCG/ACT IN AERO: 2.0000 | INHALATION_SPRAY | Freq: Two times a day (BID) | RESPIRATORY_TRACT | Status: DC

## 2012-09-23 MED ORDER — ADULT MULTIVITAMIN W/MINERALS CH
1.0000 | ORAL_TABLET | Freq: Every day | ORAL | Status: DC
  Administered 2012-09-23: 1 via ORAL
  Filled 2012-09-23: qty 1

## 2012-09-23 MED ORDER — PREDNISONE (PAK) 10 MG PO TABS: 10.0000 mg | ORAL_TABLET | ORAL | Status: DC

## 2012-09-23 MED ORDER — LEVOFLOXACIN 500 MG PO TABS: 500.0000 mg | ORAL_TABLET | Freq: Every day | ORAL | Status: DC

## 2012-09-23 NOTE — Progress Notes (Signed)
09/23/2012 Pneumococcal vaccine given in left deltoid at 1019. Lot #: [id number] and expire September 13, 2013. Biochemist, clinical.

## 2012-09-23 NOTE — Discharge Summary (Signed)
Physician Discharge Summary  Sherry Ferguson ZOX:[id number] DOB: November 09, 1985 DOA: 09/21/2012  PCP: Carollee Herter, MD  Admit date: 09/21/2012 Discharge date: 09/23/2012  Time spent: 45 minutes  Recommendations for Outpatient Follow-up:  We would recommend outpatient referral to asthma specialist   Discharge Diagnoses:  Acute respiratory failure with hypoxia on admission-resolved prior to discharge  Obesity (BMI 30-39.9)  Asthma with allergic rhinitis  Asthma with acute exacerbation  Hypokalemia   Discharge Condition: Fair  Diet recommendation: Regular  Filed Weights   09/22/12 0159 09/22/12 2220  Weight: 109.317 kg (241 lb) 110.542 kg (243 lb 11.2 oz)    History of present illness:  27 year old woman with history of asthma admitted with respiratory distress and hypoxia, loud wheezing  Hospital Course:   Acute asthma exacerbation with acute hypoxic respiratory failure  -No history of intubation/mechanical ventilation  -The patient was started on iv steroids on admission together with albuterol nebulized . At the time of the discharge the patient was placed on a prednisone taper, albuterol inhaled MDI and Symbicort twice a day - continue to have severe wheezing and hypoxia - 09/22/2012 and we did continue treatment and was initiated on admission - fluter valve was recommended and the patient had some success with bringing up sputum The patient was placed on oxygen on admission and by the time of discharge we were successfully able to wean her off of oxygen - We did place patient on iv abx  With levaquin - which we changed later to oral form for 5 more days of the discharge We did- suspect bacterial infectious process the reason why the patient was so resistant to treatment in the ED Obesity  -Counseled extensively about weight loss      Procedures:  none  Consultations:  none  Discharge Exam: Filed Vitals:   09/23/12 0504 09/23/12 0714 09/23/12 0946 09/23/12  1423  BP: 101/58  116/81   Pulse: 92  108   Temp: 97.8 F (36.6 C)  98.1 F (36.7 C)   TempSrc: Oral  Oral   Resp: 18  20   Height:      Weight:      SpO2: 97% 94% 96% 94%    General: axox3 Cardiovascular: rrr Respiratory: still some wheezing , no further hypoxia   Discharge Instructions  Discharge Orders    Future Orders Please Complete By Expires   Diet general      Increase activity slowly          Medication List     As of 09/23/2012  3:19 PM    STOP taking these medications         albuterol (2.5 MG/3ML) 0.083% nebulizer solution   Commonly known as: PROVENTIL      TAKE these medications         albuterol 108 (90 BASE) MCG/ACT inhaler   Commonly known as: PROVENTIL HFA;VENTOLIN HFA   Inhale 2 puffs into the lungs every 6 (six) hours as needed. For shortness of breath and wheezing      budesonide-formoterol 160-4.5 MCG/ACT inhaler   Commonly known as: SYMBICORT   Inhale 2 puffs into the lungs 2 (two) times daily.      fish oil-omega-3 fatty acids 1000 MG capsule   Take 2 g by mouth daily.      ibuprofen 600 MG tablet   Commonly known as: ADVIL,MOTRIN   Take 1 tablet (600 mg total) by mouth every 6 (six) hours as needed for fever. For pain  levofloxacin 500 MG tablet   Commonly known as: LEVAQUIN   Take 1 tablet (500 mg total) by mouth daily.      levonorgestrel 20 MCG/24HR IUD   Commonly known as: MIRENA   1 each by Intrauterine route once.      multivitamin with minerals Tabs   Take 1 tablet by mouth daily.      predniSONE 10 MG tablet   Commonly known as: STERAPRED UNI-PAK   Take 1 tablet (10 mg total) by mouth See admin instructions.           Follow-up Information    Schedule an appointment as soon as possible for a visit with Carollee Herter, MD.   Contact information:   [address] [place] [address] [phone]       Schedule an appointment as soon as possible for a visit with Ginnie Smart A, MD. (for  allergy and asthma )    Contact information:   [address] [place] [address] [phone]           The results of significant diagnostics from this hospitalization (including imaging, microbiology, ancillary and laboratory) are listed below for reference.    Significant Diagnostic Studies: Dg Chest 2 View  09/21/2012  *RADIOLOGY REPORT*  Clinical Data: Shortness of breath.  History of asthma.  CHEST - 2 VIEW  Comparison: 05/16/2011.  Findings: The cardiac silhouette, mediastinal and hilar contours are within normal limits and stable. The lungs are clear.  No pleural effusions.  The bony thorax is intact.  IMPRESSION: No acute cardiopulmonary findings.   Original Report Authenticated By: Rudie Meyer, M.D.     Microbiology: No results found for this or any previous visit (from the past 240 hour(s)).   Labs: Basic Metabolic Panel:  Lab 09/22/12 5784 09/21/12 2239  NA 138 139  K 4.3 3.4*  CL 107 106  CO2 23 24  GLUCOSE 91 105*  BUN 10 11  CREATININE 0.62 0.63  CALCIUM 8.8 9.0  MG -- --  PHOS -- --   Liver Function Tests: No results found for this basename: AST:5,ALT:5,ALKPHOS:5,BILITOT:5,PROT:5,ALBUMIN:5 in the last 168 hours No results found for this basename: LIPASE:5,AMYLASE:5 in the last 168 hours No results found for this basename: AMMONIA:5 in the last 168 hours CBC:  Lab 09/21/12 2239  WBC 8.3  NEUTROABS 4.2  HGB 12.9  HCT 38.3  MCV 82.9  PLT 252   Cardiac Enzymes: No results found for this basename: CKTOTAL:5,CKMB:5,CKMBINDEX:5,TROPONINI:5 in the last 168 hours BNP: BNP (last 3 results) No results found for this basename: PROBNP:3 in the last 8760 hours CBG: No results found for this basename: GLUCAP:5 in the last 168 hours     Signed:  Fallen Crisostomo  [facility] 09/23/2012, 3:19 PM

## 2013-05-05 ENCOUNTER — Ambulatory Visit: Payer: Self-pay | Admitting: Family Medicine

## 2013-07-23 ENCOUNTER — Encounter: Payer: Self-pay | Admitting: Obstetrics & Gynecology

## 2013-07-23 ENCOUNTER — Ambulatory Visit (INDEPENDENT_AMBULATORY_CARE_PROVIDER_SITE_OTHER): Payer: 59 | Admitting: Obstetrics & Gynecology

## 2013-07-23 VITALS — BP 111/73 | HR 78 | Ht 65.0 in | Wt 231.1 lb

## 2013-07-23 DIAGNOSIS — N920 Excessive and frequent menstruation with regular cycle: Secondary | ICD-10-CM

## 2013-07-23 DIAGNOSIS — R102 Pelvic and perineal pain: Secondary | ICD-10-CM

## 2013-07-23 DIAGNOSIS — N949 Unspecified condition associated with female genital organs and menstrual cycle: Secondary | ICD-10-CM

## 2013-07-23 MED ORDER — DICLOFENAC SODIUM 75 MG PO TBEC: 75.0000 mg | DELAYED_RELEASE_TABLET | Freq: Two times a day (BID) | ORAL | Status: DC

## 2013-07-23 NOTE — Progress Notes (Signed)
Subjective:     Patient ID: Sherry Ferguson, female   DOB: [birth year], 27 y.o.   MRN: [id number]  HPI Pt has had a Mirena in for 3 years.  She reports that for the last few months the bleeding has become more heavy and painful.  She reports that she got the Mirena to assist with dysmenorrhea but, it has not helped as she thought it would.  She would like to keep the mirena if we can get the pain under control. Pt was told a few years ago that she had a cyst on her ovary.       Review of Systems     Objective:   Physical Exam BP 111/73  Pulse 78  Ht 5\' 5"  (1.651 m)  Wt 231 lb 1.6 oz (104.826 kg)  BMI 38.46 kg/m2  LMP 07/13/2013 Pt in NAD Abd: soft, NT, ND GU: EGBUS: no lesions Vagina: no blood in vault Cervix: no lesion; no mucopurulent d/c Uterus: small, mobile Adnexa: no masses; non tender          Assessment:     Dysmenorrhea and menorrhagia- no NSAIDS tried.  H/o ovarian cyst    Plan:     Diclofenac 75 mg bid prn Pelvic sono F/u 3 months or sooner prn

## 2013-07-23 NOTE — Patient Instructions (Signed)
Pelvic Pain, Female Female pelvic pain can be caused by many different things and start from a variety of places. Pelvic pain refers to pain that is located in the lower half of the abdomen and between your hips. The pain may occur over a short period of time (acute) or may be reoccurring (chronic). The cause of pelvic pain may be related to disorders affecting the female reproductive organs (gynecologic), but it may also be related to the bladder, kidney stones, an intestinal complication, or muscle or skeletal problems. Getting help right away for pelvic pain is important, especially if there has been severe, sharp, or a sudden onset of unusual pain. It is also important to get help right away because some types of pelvic pain can be life threatening.  CAUSES  Below are only some of the causes of pelvic pain. The causes of pelvic pain can be in one of several categories.   Gynecologic.  Pelvic inflammatory disease.  Sexually transmitted infection.  Ovarian cyst or a twisted ovarian ligament (ovarian torsion).  Uterine lining that grows outside the uterus (endometriosis).  Fibroids, cysts, or tumors.  Ovulation.  Pregnancy.  Pregnancy that occurs outside the uterus (ectopic pregnancy).  Miscarriage.  Labor.  Abruption of the placenta or ruptured uterus.  Infection.  Uterine infection (endometritis).  Bladder infection.  Diverticulitis.  Miscarriage related to a uterine infection (septic abortion).  Bladder.  Inflammation of the bladder (cystitis).  Kidney stone(s).  Gastrointenstinal.  Constipation.  Diverticulitis.  Neurologic.  Trauma.  Feeling pelvic pain because of mental or emotional causes (psychosomatic).  Cancers of the bowel or pelvis. EVALUATION  Your caregiver will want to take a careful history of your concerns. This includes recent changes in your health, a careful gynecologic history of your periods (menses), and a sexual history. Obtaining  your family history and medical history is also important. Your caregiver may suggest a pelvic exam. A pelvic exam will help identify the location and severity of the pain. It also helps in the evaluation of which organ system may be involved. In order to identify the cause of the pelvic pain and be properly treated, your caregiver may order tests. These tests may include:   A pregnancy test.  Pelvic ultrasonography.  An X-ray exam of the abdomen.  A urinalysis or evaluation of vaginal discharge.  Blood tests. HOME CARE INSTRUCTIONS   Only take over-the-counter or prescription medicines for pain, discomfort, or fever as directed by your caregiver.   Rest as directed by your caregiver.   Eat a balanced diet.   Drink enough fluids to make your urine clear or pale yellow, or as directed.   Avoid sexual intercourse if it causes pain.   Apply warm or cold compresses to the lower abdomen depending on which one helps the pain.   Avoid stressful situations.   Keep a journal of your pelvic pain. Write down when it started, where the pain is located, and if there are things that seem to be associated with the pain, such as food or your menstrual cycle.  Follow up with your caregiver as directed.  SEEK MEDICAL CARE IF:  Your medicine does not help your pain.  You have abnormal vaginal discharge. SEEK IMMEDIATE MEDICAL CARE IF:   You have heavy bleeding from the vagina.   Your pelvic pain increases.   You feel lightheaded or faint.   You have chills.   You have pain with urination or blood in your urine.   You have uncontrolled  diarrhea or vomiting.   You have a fever or persistent symptoms for more than 3 days.  You have a fever and your symptoms suddenly get worse.   You are being physically or sexually abused.  MAKE SURE YOU:  Understand these instructions.  Will watch your condition.  Will get help if you are not doing well or get worse. Document  Released: 07/17/2004 Document Revised: 02/19/2012 Document Reviewed: 12/10/2011 [facility] Patient Information 2014 [place], [place]. Menorrhagia Dysfunctional uterine bleeding is different from a normal menstrual period. When periods are heavy or there is more bleeding than is usual for you, it is called menorrhagia. It may be caused by hormonal imbalance, or physical, metabolic, or other problems. Examination is necessary in order that your caregiver may treat treatable causes. If this is a continuing problem, a D&C may be needed. That means that the cervix (the opening of the uterus or womb) is dilated (stretched larger) and the lining of the uterus is scraped out. The tissue scraped out is then examined under a microscope by a specialist (pathologist) to make sure there is nothing of concern that needs further or more extensive treatment. HOME CARE INSTRUCTIONS   If medications were prescribed, take exactly as directed. Do not change or switch medications without consulting your caregiver.  Long term heavy bleeding may result in iron deficiency. Your caregiver may have prescribed iron pills. They help replace the iron your body lost from heavy bleeding. Take exactly as directed. Iron may cause constipation. If this becomes a problem, increase the bran, fruits, and roughage in your diet.  Do not take aspirin or medicines that contain aspirin one week before or during your menstrual period. Aspirin may make the bleeding worse.  If you need to change your sanitary pad or tampon more than once every 2 hours, stay in bed and rest as much as possible until the bleeding stops.  Eat well-balanced meals. Eat foods high in iron. Examples are leafy green vegetables, meat, liver, eggs, and whole grain breads and cereals. Do not try to lose weight until the abnormal bleeding has stopped and your blood iron level is back to normal. SEEK MEDICAL CARE IF:   You need to change your sanitary pad or tampon more than  once an hour.  You develop nausea (feeling sick to your stomach) and vomiting, dizziness, or diarrhea while you are taking your medicine.  You have any problems that may be related to the medicine you are taking. SEEK IMMEDIATE MEDICAL CARE IF:   You have a fever.  You develop chills.  You develop severe bleeding or start to pass blood clots.  You feel dizzy or faint. MAKE SURE YOU:   Understand these instructions.  Will watch your condition.  Will get help right away if you are not doing well or get worse. Document Released: 08/20/2005 Document Revised: 11/12/2011 Document Reviewed: 02/08/2013 [facility] Patient Information 2014 [place], [place].

## 2013-07-23 NOTE — Progress Notes (Signed)
Pt had a very heavy period that started on 11/10. She is also experiencing severe cramps. She has been experiencing periods that are heavy for the past year.

## 2013-08-05 ENCOUNTER — Ambulatory Visit (HOSPITAL_COMMUNITY)
Admission: RE | Admit: 2013-08-05 | Discharge: 2013-08-05 | Disposition: A | Discharge status: Home / Self Care | Payer: Self-pay | Source: Ambulatory Visit | Attending: Obstetrics & Gynecology | Admitting: Obstetrics & Gynecology

## 2013-08-05 DIAGNOSIS — R102 Pelvic and perineal pain: Secondary | ICD-10-CM

## 2013-08-05 DIAGNOSIS — N920 Excessive and frequent menstruation with regular cycle: Secondary | ICD-10-CM | POA: Insufficient documentation

## 2013-08-05 DIAGNOSIS — N949 Unspecified condition associated with female genital organs and menstrual cycle: Secondary | ICD-10-CM | POA: Insufficient documentation

## 2013-08-05 DIAGNOSIS — Z30431 Encounter for routine checking of intrauterine contraceptive device: Secondary | ICD-10-CM | POA: Insufficient documentation

## 2013-08-05 DIAGNOSIS — N83209 Unspecified ovarian cyst, unspecified side: Secondary | ICD-10-CM | POA: Insufficient documentation

## 2013-08-10 ENCOUNTER — Telehealth: Payer: Self-pay

## 2013-08-10 NOTE — Telephone Encounter (Signed)
Message copied by Louanna Raw on Mon Aug 10, 2013  4:05 PM ------      Message from: Willodean Rosenthal      Created: Sun Aug 09, 2013  3:26 PM       Please call pt.  Her IUD is malpositioned on sono.  She may f/u with me to have it removed and replaced.            clh-S  ------

## 2013-08-10 NOTE — Telephone Encounter (Signed)
Called pt. Home phone and left a message stating we are trying to reach her with results and to call clinic. Cell phone number disconnected.  Scheduled an appointment for 08/20/13 at 2:15 with Dr. Erin Fulling to have IUD removed and one inserted-- pt. Needs to be informed of appointment and reason for appointment.

## 2013-08-11 NOTE — Telephone Encounter (Signed)
Called Sherry Ferguson and notified her per Dr. Burnice LoganKatrinka Blazing that ultrasound shows IUD is malpositioned and reccomendation is to remove it and replace it. We discussed [name] does not have insurance so she is going to fill out an ARch form for a free IUD- once we hear back that she is approved - plan is to removed IUD and place new one at one appointment.

## 2013-08-20 ENCOUNTER — Encounter: Payer: Self-pay | Admitting: Obstetrics & Gynecology

## 2013-08-20 ENCOUNTER — Ambulatory Visit (INDEPENDENT_AMBULATORY_CARE_PROVIDER_SITE_OTHER): Payer: Self-pay | Admitting: Obstetrics & Gynecology

## 2013-08-20 VITALS — BP 115/75 | HR 85 | Temp 98.2°F | Ht 64.0 in | Wt 236.2 lb

## 2013-08-20 DIAGNOSIS — R102 Pelvic and perineal pain unspecified side: Secondary | ICD-10-CM

## 2013-08-20 DIAGNOSIS — Z30432 Encounter for removal of intrauterine contraceptive device: Secondary | ICD-10-CM

## 2013-08-20 DIAGNOSIS — N926 Irregular menstruation, unspecified: Secondary | ICD-10-CM

## 2013-08-20 DIAGNOSIS — R6882 Decreased libido: Secondary | ICD-10-CM

## 2013-08-20 DIAGNOSIS — N949 Unspecified condition associated with female genital organs and menstrual cycle: Secondary | ICD-10-CM

## 2013-08-20 MED ORDER — NORGESTIMATE-ETH ESTRADIOL 0.25-35 MG-MCG PO TABS: 1.0000 | ORAL_TABLET | Freq: Every day | ORAL | Status: DC

## 2013-08-20 NOTE — Patient Instructions (Addendum)
Menorrhagia Dysfunctional uterine bleeding is different from a normal menstrual period. When periods are heavy or there is more bleeding than is usual for you, it is called menorrhagia. It may be caused by hormonal imbalance, or physical, metabolic, or other problems. Examination is necessary in order that your caregiver may treat treatable causes. If this is a continuing problem, a D&C may be needed. That means that the cervix (the opening of the uterus or womb) is dilated (stretched larger) and the lining of the uterus is scraped out. The tissue scraped out is then examined under a microscope by a specialist (pathologist) to make sure there is nothing of concern that needs further or more extensive treatment. HOME CARE INSTRUCTIONS   If medications were prescribed, take exactly as directed. Do not change or switch medications without consulting your caregiver.  Long term heavy bleeding may result in iron deficiency. Your caregiver may have prescribed iron pills. They help replace the iron your body lost from heavy bleeding. Take exactly as directed. Iron may cause constipation. If this becomes a problem, increase the bran, fruits, and roughage in your diet.  Do not take aspirin or medicines that contain aspirin one week before or during your menstrual period. Aspirin may make the bleeding worse.  If you need to change your sanitary pad or tampon more than once every 2 hours, stay in bed and rest as much as possible until the bleeding stops.  Eat well-balanced meals. Eat foods high in iron. Examples are leafy green vegetables, meat, liver, eggs, and whole grain breads and cereals. Do not try to lose weight until the abnormal bleeding has stopped and your blood iron level is back to normal. SEEK MEDICAL CARE IF:   You need to change your sanitary pad or tampon more than once an hour.  You develop nausea (feeling sick to your stomach) and vomiting, dizziness, or diarrhea while you are taking your  medicine.  You have any problems that may be related to the medicine you are taking. SEEK IMMEDIATE MEDICAL CARE IF:   You have a fever.  You develop chills.  You develop severe bleeding or start to pass blood clots.  You feel dizzy or faint. MAKE SURE YOU:   Understand these instructions.  Will watch your condition.  Will get help right away if you are not doing well or get worse. Document Released: 08/20/2005 Document Revised: 11/12/2011 Document Reviewed: 02/08/2013 [facility] Patient Information 2014 [place], [place]. Oral Contraception Information Oral contraceptives (OCs) are medicines taken to prevent pregnancy. OCs work by preventing the ovaries from releasing eggs. The hormones in OCs also cause the cervical mucus to thicken, preventing the sperm from entering the uterus. The hormones also cause the uterine lining to become thin, not allowing a fertilized egg to attach to the inside of the uterus. OCs are highly effective when taken exactly as prescribed. However, OCs do not prevent sexually transmitted diseases (STDs). Safe sex practices, such as using condoms along with the pill, can help prevent STDs.  Before taking the pill, you may have a physical exam and Pap test. Your caregiver may order blood tests that may be necessary. Your caregiver will make sure you are a good candidate for oral contraception. Discuss with your caregiver the possible side effects of the OC you may be prescribed. When starting an OC, it can take 2 to 3 months for the body to adjust to the changes in hormone levels in your body.  TYPES OF ORAL CONTRACEPTION  The  combination pill. This pill contains estrogen and progestin (synthetic progesterone) hormones. The combination pill comes in either 21-day or 28-day packs. With 21-day packs, you do not take pills for 7 days after the last pill. With 28-day packs, the pill is taken every day. The last 7 pills are without hormones. Certain types of pills have more  than 21 hormone-containing pills.  The minipill. This pill contains the progesterone hormone only. It is taken every day continuously. The minipill comes in packs of 91 pills. The first 84 pills contain the hormones, and the last 7 pills do not. The last 7 days are when you will have your menstrual period. You may experience irregular spotting. ADVANTAGES  Decreases premenstrual symptoms.  Treats menstrual period cramps.  Regulates the menstrual cycle.  Decreases a heavy menstrual flow.  Treats acne.  Treats abnormal uterine bleeding.  Treats chronic pelvic pain.  Treats polycystic ovarian syndrome.  Treats endometriosis.  Can be used as emergency contraception. DISADVANTAGES OCs can be less effective if:  You forget to take the pill at the same time every day.  You have a stomach or intestinal disease that lessens the absorption of the pill.  You take OCs with other medicines that make OCs less effective.  You take expired OCs.  You forget to restart the pill on day 7, when using the packs of 21 pills. Document Released: 11/10/2002 Document Revised: 11/12/2011 Document Reviewed: 02/08/2013 [facility] Patient Information 2014 [place], [place].

## 2013-08-20 NOTE — Progress Notes (Signed)
Subjective:     Patient ID: Sherry Ferguson, female   DOB: 03-15-1986, 27 y.o.   MRN: [id number]  HPI  Pt c/o pain and decreased libido.  The pain is more recent however, she reports decreased libido since the IUD was placed 3 years prev.  She reports a h/o irreg cycles.    Review of Systems     Objective:   Physical Exam BP 115/75  Pulse 85  Temp(Src) 98.2 F (36.8 C) (Oral)  Ht 5\' 4"  (1.626 m)  Wt 236 lb 3.2 oz (107.14 kg)  BMI 40.52 kg/m2  LMP 08/18/2013  Patient was in the dorsal lithotomy position, normal external genitalia was noted.  A speculum was placed in the patient's vagina, normal discharge was noted, no lesions. The multiparous cervix was visualized, no lesions, no abnormal discharge. The strings of the IUD were grasped and pulled using ring forceps.  The IUD was successfully removed in its entirety.  Patient tolerated the procedure well.     EXAM:  TRANSABDOMINAL AND TRANSVAGINAL ULTRASOUND OF PELVIS  TECHNIQUE:  Both transabdominal and transvaginal ultrasound examinations of the  pelvis were performed. Transabdominal technique was performed for  global imaging of the pelvis including uterus, ovaries, adnexal  regions, and pelvic cul-de-sac. It was necessary to proceed with  endovaginal exam following the transabdominal exam to visualize the  endometrium.  COMPARISON: 05/03/2011  FINDINGS:  Uterus  Measurements: 7.9 x 4.3 x 4.7 cm. No fibroids or other mass  visualized.  Endometrium  Thickness: 7 mm, normal. IUD of lower uterine segment. No  endometrial fluid or focal abnormality.  Right ovary  Measurements: 3.4 x 2.5 x 3.6 cm. Small cyst with simple features  2.9 x 2.3 x 1.9 cm. No additional right ovarian mass lesions.  Left ovary  Measurements: 3.2 x 1.7 x 1.5 cm. Normal morphology without mass.  Other findings  No free pelvic fluid or adnexal masses.  IMPRESSION:  Small right ovarian cyst 2.9 cm diameter.  Malpositioned IUD at lower uterine segment.   Otherwise negative exam.       Assessment:     Pelvic pain- malposition of IUD- IUD removed Decreased libido- sx began after placement of Mirena.  Rec reassess sx at next vist  After Mirena removed Contraception management- no contraindication to IUD- Sprintec    Plan:     Sprintec 1 po q day F/u 3 months IUD removed

## 2013-08-31 ENCOUNTER — Encounter: Payer: Self-pay | Admitting: *Deleted

## 2013-10-23 ENCOUNTER — Telehealth: Payer: Self-pay | Admitting: Medical

## 2013-10-23 MED ORDER — BUDESONIDE-FORMOTEROL FUMARATE 160-4.5 MCG/ACT IN AERO: 2.0000 | INHALATION_SPRAY | Freq: Two times a day (BID) | RESPIRATORY_TRACT | Status: DC

## 2013-10-23 NOTE — Telephone Encounter (Signed)
lm

## 2013-10-26 ENCOUNTER — Ambulatory Visit (INDEPENDENT_AMBULATORY_CARE_PROVIDER_SITE_OTHER): Payer: Self-pay | Admitting: Medical

## 2013-10-26 ENCOUNTER — Encounter: Payer: Self-pay | Admitting: Medical

## 2013-10-26 VITALS — BP 112/78 | HR 75 | Temp 98.1°F | Resp 16 | Wt 236.0 lb

## 2013-10-26 DIAGNOSIS — J45901 Unspecified asthma with (acute) exacerbation: Secondary | ICD-10-CM

## 2013-10-26 DIAGNOSIS — M2240 Chondromalacia patellae, unspecified knee: Secondary | ICD-10-CM

## 2013-10-26 DIAGNOSIS — M25561 Pain in right knee: Secondary | ICD-10-CM

## 2013-10-26 DIAGNOSIS — M25562 Pain in left knee: Secondary | ICD-10-CM

## 2013-10-26 DIAGNOSIS — M25569 Pain in unspecified knee: Secondary | ICD-10-CM

## 2013-10-26 DIAGNOSIS — M7650 Patellar tendinitis, unspecified knee: Secondary | ICD-10-CM

## 2013-10-26 DIAGNOSIS — E669 Obesity, unspecified: Secondary | ICD-10-CM

## 2013-10-26 MED ORDER — ALBUTEROL SULFATE (2.5 MG/3ML) 0.083% IN NEBU: 2.5000 mg | INHALATION_SOLUTION | Freq: Four times a day (QID) | RESPIRATORY_TRACT | Status: DC | PRN

## 2013-10-26 MED ORDER — ALBUTEROL SULFATE HFA 108 (90 BASE) MCG/ACT IN AERS: 2.0000 | INHALATION_SPRAY | Freq: Four times a day (QID) | RESPIRATORY_TRACT | Status: DC | PRN

## 2013-10-26 MED ORDER — BUDESONIDE-FORMOTEROL FUMARATE 160-4.5 MCG/ACT IN AERO: 2.0000 | INHALATION_SPRAY | Freq: Two times a day (BID) | RESPIRATORY_TRACT | Status: DC

## 2013-10-26 NOTE — Progress Notes (Signed)
Subjective:   Sherry Ferguson is a 28 y.o. female presenting on 10/26/2013 with Asthma  Last visit 06/2012 for asthma.  Restarted sample of Symbicort about a month ago, and did fine on Symbicort each time she has been on the medication. However, has been without medication due to being out of insurance.  Has been to the emergency department for several flareups within the past year.  Usually illness or seasonal allergies flare of her asthma.  The last time I saw her she was doing really well in Symbicort twice daily, rarely had to use the albuterol.  Once she ran out of Symbicort sometime last year is when she started getting flareups.  Her husband smokes outside, and she is trying to get him to quit but he currently will not quit.  She reports ongoing knee pains bilaterally.  No swelling, but gets pain going up or down stairs, feels like the left knee wants to pop out at times.  Still working on efforts to lose weight exercise and diet.  Takes ibuprofen about every other day for pain.  Denies fall, injury, trauma. She saw Dr. Susann GivensLalonde for this here within the past year, was advised to use home exercises for patellofemoral syndrome. No other aggravating or relieving factors.  No other complaint.  Review of Systems ROS as in subjective      Objective:    Filed Vitals:   10/26/13 1117  BP: 112/78  Pulse: 75  Temp: 98.1 F (36.7 C)  Resp: 16    General appearance: alert, no distress, WD/WN, obese AA female HEENT: normocephalic, sclerae anicteric, TMs pearly, nares patent, no discharge or erythema, pharynx normal Oral cavity: MMM, no lesions Neck: supple, no lymphadenopathy, no thyromegaly, no masses Heart: RRR, normal S1, S2, no murmurs Lungs: CTA bilaterally, no wheezes, rhonchi, or rales MSK: crepitus felt of bilat knees, tender over bilat patellar tendons, mild pain with ROM in general, otherwise no laxity, no obvious deformity, no swelling, negative McMurrays and lachmans. Neuro:  normal LE strength, sensation, DTRs. Pulses: 2+ symmetric, upper and lower extremities, normal cap refill      Assessment: Encounter Diagnoses  Name Primary?  Marland Kitchen. Asthma with acute exacerbation Yes  . Knee pain, bilateral   . Obesity, unspecified   . Patellofemoral chondrosis   . Patellar tendonitis      Plan: Asthma - c/t Symbicort BID, albuterol prn.  Avoid triggers.  Use OTC antihistamine QHS daily.   We will refer to [employer] study for asthma.  This will allow compensation, free medication (Symbicort), routine f/u, particularly since she has no insurance at this time.  Recheck in 331mo pending study enrollment  Knee pain - discussed that symptoms and exam suggest obesity as a factor, tendonitis and patellofemoral syndrome.   Begin Aleve OTC, ice QHS, use OTC Chopath, and demonstrated home exercises.  Recheck in 331mo.  Obesity - advised c/t efforts at weight loss through healthy diet, exercise.  Sherry Ferguson was seen today for asthma.  Diagnoses and associated orders for this visit:  Asthma with acute exacerbation  Knee pain, bilateral  Obesity, unspecified  Patellofemoral chondrosis  Patellar tendonitis  Other Orders - Cancel: Pulse oximetry (single); Standing - albuterol (PROVENTIL HFA;VENTOLIN HFA) 108 (90 BASE) MCG/ACT inhaler; Inhale 2 puffs into the lungs every 6 (six) hours as needed for wheezing or shortness of breath. - albuterol (PROVENTIL) (2.5 MG/3ML) 0.083% nebulizer solution; Take 3 mLs (2.5 mg total) by nebulization every 6 (six) hours as needed for wheezing or  shortness of breath. - budesonide-formoterol (SYMBICORT) 160-4.5 MCG/ACT inhaler; Inhale 2 puffs into the lungs 2 (two) times daily.    Return pending entry into [employer].

## 2013-10-26 NOTE — Patient Instructions (Signed)
Knee pain - patellofemoral syndrome and patellar tendonitis  Use Aleve over-the-counter daily for the next several weeks  Use ice pack for the knees in the evenings when you get home from work  Begin using OTC Chopath device for both knees daily  Use the stretches and squats I demonstrated  Consider using OTC Glucosamine-Chondroitin or fish oil tablets once daily for joint lubrication   We will refer you for the [employer]quest asthma study

## 2014-01-08 ENCOUNTER — Encounter (HOSPITAL_COMMUNITY): Payer: Self-pay | Admitting: Emergency Medicine

## 2014-01-08 ENCOUNTER — Emergency Department (INDEPENDENT_AMBULATORY_CARE_PROVIDER_SITE_OTHER)
Admission: EM | Admit: 2014-01-08 | Discharge: 2014-01-08 | Disposition: A | Discharge status: Home / Self Care | Payer: Self-pay | Source: Home / Self Care | Attending: Emergency Medicine | Admitting: Emergency Medicine

## 2014-01-08 DIAGNOSIS — M549 Dorsalgia, unspecified: Secondary | ICD-10-CM

## 2014-01-08 DIAGNOSIS — J329 Chronic sinusitis, unspecified: Secondary | ICD-10-CM

## 2014-01-08 MED ORDER — METHYLPREDNISOLONE ACETATE 80 MG/ML IJ SUSP
INTRAMUSCULAR | Status: AC
  Filled 2014-01-08: qty 1

## 2014-01-08 MED ORDER — METHYLPREDNISOLONE ACETATE 80 MG/ML IJ SUSP
80.0000 mg | Freq: Once | INTRAMUSCULAR | Status: AC
  Administered 2014-01-08: 80 mg via INTRAMUSCULAR

## 2014-01-08 MED ORDER — AMOXICILLIN-POT CLAVULANATE 875-125 MG PO TABS: 1.0000 | ORAL_TABLET | Freq: Two times a day (BID) | ORAL | Status: DC

## 2014-01-08 NOTE — ED Provider Notes (Signed)
CSN: [id number]     Arrival date & time 01/08/14  1950 History   First MD Initiated Contact with Patient 01/08/14 2016     Chief Complaint  Patient presents with  . URI   (Consider location/radiation/quality/duration/timing/severity/associated sxs/prior Treatment) HPI Comments: Patient presents with a 10 day history of initially allergies, that led to sinus pressure, fatigue, headaches and subjective fever. Mild cough. Sinus drainage thick, green, yellow and at times blood tinged.  Also has low back pain for 2 days after starting 2 new jobs and standing all day. Ask for advice on what she can take. No radicular pain, weakness or numbness is noted. Full ROM  Patient is a 28 y.o. female presenting with URI. The history is provided by the patient.  URI Presenting symptoms: congestion, cough, fatigue, fever and rhinorrhea   Presenting symptoms: no ear pain   Associated symptoms: sneezing   Associated symptoms: no myalgias, no neck pain and no wheezing     Past Medical History  Diagnosis Date  . Allergy   . Asthma   . IUD (intrauterine device) in place    History reviewed. No pertinent past surgical history. Family History  Problem Relation Age of Onset  . Asthma Mother   . Diabetes Father   . Hyperlipidemia Father   . Hypertension Father    History  Substance Use Topics  . Smoking status: Former Smoker    Types: Cigarettes    Quit date: 09/04/2007  . Smokeless tobacco: Never Used  . Alcohol Use: No   OB History   Grav Para Term Preterm Abortions TAB SAB Ect Mult Living   2 2 2       2      Review of Systems  Constitutional: Positive for fever and fatigue. Negative for chills.  HENT: Positive for congestion, rhinorrhea, sinus pressure and sneezing. Negative for ear pain, facial swelling, nosebleeds and postnasal drip.   Respiratory: Positive for cough. Negative for wheezing.   Gastrointestinal: Negative.   Musculoskeletal: Positive for back pain. Negative for gait problem,  joint swelling, myalgias, neck pain and neck stiffness.  Skin: Negative.   Neurological: Negative for tremors, weakness and numbness.  Psychiatric/Behavioral: Negative.     Allergies  Review of patient's allergies indicates no known allergies.  Home Medications   Prior to Admission medications   Medication Sig Start Date End Date Taking? Authorizing Provider  albuterol (PROVENTIL HFA;VENTOLIN HFA) 108 (90 BASE) MCG/ACT inhaler Inhale 2 puffs into the lungs every 6 (six) hours as needed for wheezing or shortness of breath. 10/26/13   Kermit Baloavid S Tysinger, PA-C  albuterol (PROVENTIL) (2.5 MG/3ML) 0.083% nebulizer solution Take 3 mLs (2.5 mg total) by nebulization every 6 (six) hours as needed for wheezing or shortness of breath. 10/26/13   Kermit Baloavid S Tysinger, PA-C  budesonide-formoterol (SYMBICORT) 160-4.5 MCG/ACT inhaler Inhale 2 puffs into the lungs 2 (two) times daily. 10/26/13   Kermit Baloavid S Tysinger, PA-C  norgestimate-ethinyl estradiol (ORTHO-CYCLEN,SPRINTEC,PREVIFEM) 0.25-35 MG-MCG tablet Take 1 tablet by mouth daily. 08/20/13   Willodean Rosenthalarolyn Harraway-Smith, MD   BP 112/81  Pulse 67  Temp(Src) 97.8 F (36.6 C) (Oral)  Resp 14  SpO2 98%  LMP 11/08/2013 Physical Exam  Nursing note and vitals reviewed. Constitutional: She is oriented to person, place, and time. She appears well-developed and well-nourished. No distress.  HENT:  Head: Normocephalic and atraumatic.  Right Ear: External ear normal.  Left Ear: External ear normal.  Mouth/Throat: Oropharynx is clear and moist. No oropharyngeal exudate.  Swollen erythematous  turbinates, pain with percussion of maxillary sinus bilaterally  Eyes: Pupils are equal, round, and reactive to light. Right eye exhibits no discharge. Left eye exhibits no discharge.  Neck: Normal range of motion. Neck supple.  Cardiovascular: Normal rate and regular rhythm.   Pulmonary/Chest: Effort normal and breath sounds normal. No respiratory distress. She has no wheezes.   Musculoskeletal: Normal range of motion. She exhibits no edema and no tenderness.  Lymphadenopathy:    She has no cervical adenopathy.  Neurological: She is alert and oriented to person, place, and time. No cranial nerve deficit.  Skin: Skin is warm and dry. No rash noted. She is not diaphoretic.  Psychiatric: Her behavior is normal.    ED Course  Procedures (including critical care time) Labs Review Labs Reviewed - No data to display  Imaging Review No results found.   MDM   1. Sinusitis   2. Back pain    Depo injection for sinus congestion/allergies and some back pain. Continue use of medications for allergies, cover with an antibiotic given duration. Ok to use Ibuprofen or Aleve for low back. Exercises on strengthening, and ROM with education on weight loss as well. F/U as needed.     Riki SheerMichelle G Young, PA-C 01/08/14 2109

## 2014-01-08 NOTE — ED Notes (Signed)
Stuffy head, headache, cough, blowing yellow-green and coughing up yellow green, back ache.  Onset of symptoms x 1 week ago.  Patient reports back pain for 2 weeks, thinks related to starting 2 jobs

## 2014-01-08 NOTE — Discharge Instructions (Signed)
Back Exercises Back exercises help treat and prevent back injuries. The goal of back exercises is to increase the strength of your abdominal and back muscles and the flexibility of your back. These exercises should be started when you no longer have back pain. Back exercises include:  Pelvic Tilt. Lie on your back with your knees bent. Tilt your pelvis until the lower part of your back is against the floor. Hold this position 5 to 10 sec and repeat 5 to 10 times.  Knee to Chest. Pull first 1 knee up against your chest and hold for 20 to 30 seconds, repeat this with the other knee, and then both knees. This may be done with the other leg straight or bent, whichever feels better.  Sit-Ups or Curl-Ups. Bend your knees 90 degrees. Start with tilting your pelvis, and do a partial, slow sit-up, lifting your trunk only 30 to 45 degrees off the floor. Take at least 2 to 3 seconds for each sit-up. Do not do sit-ups with your knees out straight. If partial sit-ups are difficult, simply do the above but with only tightening your abdominal muscles and holding it as directed.  Hip-Lift. Lie on your back with your knees flexed 90 degrees. Push down with your feet and shoulders as you raise your hips a couple inches off the floor; hold for 10 seconds, repeat 5 to 10 times.  Back arches. Lie on your stomach, propping yourself up on bent elbows. Slowly press on your hands, causing an arch in your low back. Repeat 3 to 5 times. Any initial stiffness and discomfort should lessen with repetition over time.  Shoulder-Lifts. Lie face down with arms beside your body. Keep hips and torso pressed to floor as you slowly lift your head and shoulders off the floor. Do not overdo your exercises, especially in the beginning. Exercises may cause you some mild back discomfort which lasts for a few minutes; however, if the pain is more severe, or lasts for more than 15 minutes, do not continue exercises until you see your caregiver.  Improvement with exercise therapy for back problems is slow.  See your caregivers for assistance with developing a proper back exercise program. Document Released: 09/27/2004 Document Revised: 11/12/2011 Document Reviewed: 06/21/2011 [facility] Patient Information 2014 [place], [place].  Sinusitis Sinusitis is redness, soreness, and swelling (inflammation) of the paranasal sinuses. Paranasal sinuses are air pockets within the bones of your face (beneath the eyes, the middle of the forehead, or above the eyes). In healthy paranasal sinuses, mucus is able to drain out, and air is able to circulate through them by way of your nose. However, when your paranasal sinuses are inflamed, mucus and air can become trapped. This can allow bacteria and other germs to grow and cause infection. Sinusitis can develop quickly and last only a short time (acute) or continue over a long period (chronic). Sinusitis that lasts for more than 12 weeks is considered chronic.  CAUSES  Causes of sinusitis include:  Allergies.  Structural abnormalities, such as displacement of the cartilage that separates your nostrils (deviated septum), which can decrease the air flow through your nose and sinuses and affect sinus drainage.  Functional abnormalities, such as when the small hairs (cilia) that line your sinuses and help remove mucus do not work properly or are not present. SYMPTOMS  Symptoms of acute and chronic sinusitis are the same. The primary symptoms are pain and pressure around the affected sinuses. Other symptoms include:  Upper toothache.  Earache.  Headache.  Bad breath.  Decreased sense of smell and taste.  A cough, which worsens when you are lying flat.  Fatigue.  Fever.  Thick drainage from your nose, which often is green and may contain pus (purulent).  Swelling and warmth over the affected sinuses. DIAGNOSIS  Your caregiver will perform a physical exam. During the exam, your caregiver  may:  Look in your nose for signs of abnormal growths in your nostrils (nasal polyps).  Tap over the affected sinus to check for signs of infection.  View the inside of your sinuses (endoscopy) with a special imaging device with a light attached (endoscope), which is inserted into your sinuses. If your caregiver suspects that you have chronic sinusitis, one or more of the following tests may be recommended:  Allergy tests.  Nasal culture A sample of mucus is taken from your nose and sent to a lab and screened for bacteria.  Nasal cytology A sample of mucus is taken from your nose and examined by your caregiver to determine if your sinusitis is related to an allergy. TREATMENT  Most cases of acute sinusitis are related to a viral infection and will resolve on their own within 10 days. Sometimes medicines are prescribed to help relieve symptoms (pain medicine, decongestants, nasal steroid sprays, or saline sprays).  However, for sinusitis related to a bacterial infection, your caregiver will prescribe antibiotic medicines. These are medicines that will help kill the bacteria causing the infection.  Rarely, sinusitis is caused by a fungal infection. In theses cases, your caregiver will prescribe antifungal medicine. For some cases of chronic sinusitis, surgery is needed. Generally, these are cases in which sinusitis recurs more than 3 times per year, despite other treatments. HOME CARE INSTRUCTIONS   Drink plenty of water. Water helps thin the mucus so your sinuses can drain more easily.  Use a humidifier.  Inhale steam 3 to 4 times a day (for example, sit in the bathroom with the shower running).  Apply a warm, moist washcloth to your face 3 to 4 times a day, or as directed by your caregiver.  Use saline nasal sprays to help moisten and clean your sinuses.  Take over-the-counter or prescription medicines for pain, discomfort, or fever only as directed by your caregiver. SEEK IMMEDIATE  MEDICAL CARE IF:  You have increasing pain or severe headaches.  You have nausea, vomiting, or drowsiness.  You have swelling around your face.  You have vision problems.  You have a stiff neck.  You have difficulty breathing. MAKE SURE YOU:   Understand these instructions.  Will watch your condition.  Will get help right away if you are not doing well or get worse. Document Released: 08/20/2005 Document Revised: 11/12/2011 Document Reviewed: 09/04/2011 [facility] Patient Information 2014 [place], [place].    May use Ibuprofen 800mg  every 8 hours with Tylenol Arthritis as directed to supplement discomfort as needed. Back stretches are important to keep muscle strong For sinuses use nettie pot, continue allergy medication. Ibuprofen with also help sinus. Push fluids during this time

## 2014-01-09 NOTE — ED Provider Notes (Signed)
Medical screening examination/treatment/procedure(s) were performed by non-physician practitioner and as supervising physician I was immediately available for consultation/collaboration.  Cheikh Bramble, M.D.  Una Yeomans C Aveen Stansel, MD 01/09/14 1912 

## 2014-05-05 ENCOUNTER — Emergency Department (INDEPENDENT_AMBULATORY_CARE_PROVIDER_SITE_OTHER)
Admission: EM | Admit: 2014-05-05 | Discharge: 2014-05-05 | Disposition: A | Discharge status: Home / Self Care | Payer: Self-pay | Source: Home / Self Care | Attending: Family Medicine | Admitting: Family Medicine

## 2014-05-05 ENCOUNTER — Telehealth: Payer: Self-pay | Admitting: Medical

## 2014-05-05 ENCOUNTER — Encounter (HOSPITAL_COMMUNITY): Payer: Self-pay | Admitting: Emergency Medicine

## 2014-05-05 DIAGNOSIS — J45901 Unspecified asthma with (acute) exacerbation: Secondary | ICD-10-CM

## 2014-05-05 DIAGNOSIS — J309 Allergic rhinitis, unspecified: Secondary | ICD-10-CM

## 2014-05-05 HISTORY — DX: Acute bronchitis, unspecified: J20.9

## 2014-05-05 MED ORDER — METHYLPREDNISOLONE SODIUM SUCC 125 MG IJ SOLR
125.0000 mg | Freq: Once | INTRAMUSCULAR | Status: DC
  Administered 2014-05-05: 125 mg via INTRAVENOUS

## 2014-05-05 MED ORDER — METHYLPREDNISOLONE SODIUM SUCC 125 MG IJ SOLR
125.0000 mg | Freq: Once | INTRAMUSCULAR | Status: AC
  Administered 2014-05-05: 125 mg via INTRAMUSCULAR

## 2014-05-05 MED ORDER — PREDNISONE 50 MG PO TABS: ORAL_TABLET | ORAL | Status: DC

## 2014-05-05 MED ORDER — BENZOCAINE 20 % MT SOLN
Freq: Once | OROMUCOSAL | Status: AC
  Administered 2014-05-05: 18:00:00 via OROMUCOSAL

## 2014-05-05 MED ORDER — METHYLPREDNISOLONE SODIUM SUCC 125 MG IJ SOLR
INTRAMUSCULAR | Status: AC
  Filled 2014-05-05: qty 2

## 2014-05-05 MED ORDER — FLUTICASONE PROPIONATE 50 MCG/ACT NA SUSP: 1.0000 | Freq: Every day | NASAL | Status: DC

## 2014-05-05 MED ORDER — IPRATROPIUM-ALBUTEROL 0.5-2.5 (3) MG/3ML IN SOLN
RESPIRATORY_TRACT | Status: AC
  Filled 2014-05-05: qty 3

## 2014-05-05 MED ORDER — IPRATROPIUM-ALBUTEROL 0.5-2.5 (3) MG/3ML IN SOLN
3.0000 mL | Freq: Once | RESPIRATORY_TRACT | Status: AC
  Administered 2014-05-05: 3 mL via RESPIRATORY_TRACT

## 2014-05-05 MED ORDER — IBUPROFEN 100 MG/5ML PO SUSP
ORAL | Status: AC
  Filled 2014-05-05: qty 30

## 2014-05-05 MED ORDER — ALBUTEROL SULFATE HFA 108 (90 BASE) MCG/ACT IN AERS: 2.0000 | INHALATION_SPRAY | Freq: Four times a day (QID) | RESPIRATORY_TRACT | Status: DC | PRN

## 2014-05-05 MED ORDER — IBUPROFEN 600 MG PO TABS
600.0000 mg | ORAL_TABLET | Freq: Once | ORAL | Status: AC
  Administered 2014-05-05: 600 mg via ORAL

## 2014-05-05 MED ORDER — BENZOCAINE 20 % MT SOLN
OROMUCOSAL | Status: AC
  Filled 2014-05-05: qty 57

## 2014-05-05 NOTE — Discharge Instructions (Signed)
You are experiencing an asthma exacerbation or flare You were given breathing treatments and a steroid injection to help reverse the asthma flare Please continue taking prednisone daily Please use the albuterol every 4 hours for the next 2-3 days Please start the flonase after stopping the steroids Please continue using nasal saline multiple times per day Please consider using zyrtec or another allergy medication Please come back if you do not get better or go to the ED if you get worse  Asthma, Acute Bronchospasm Acute bronchospasm caused by asthma is also referred to as an asthma attack. Bronchospasm means your air passages become narrowed. The narrowing is caused by inflammation and tightening of the muscles in the air tubes (bronchi) in your lungs. This can make it hard to breathe or cause you to wheeze and cough. CAUSES Possible triggers are:  Animal dander from the skin, hair, or feathers of animals.  Dust mites contained in house dust.  Cockroaches.  Pollen from trees or grass.  Mold.  Cigarette or tobacco smoke.  Air pollutants such as dust, household cleaners, hair sprays, aerosol sprays, paint fumes, strong chemicals, or strong odors.  Cold air or weather changes. Cold air may trigger inflammation. Winds increase molds and pollens in the air.  Strong emotions such as crying or laughing hard.  Stress.  Certain medicines such as aspirin or beta-blockers.  Sulfites in foods and drinks, such as dried fruits and wine.  Infections or inflammatory conditions, such as a flu, cold, or inflammation of the nasal membranes (rhinitis).  Gastroesophageal reflux disease (GERD). GERD is a condition where stomach acid backs up into your esophagus.  Exercise or strenuous activity. SIGNS AND SYMPTOMS   Wheezing.  Excessive coughing, particularly at night.  Chest tightness.  Shortness of breath. DIAGNOSIS  Your health care provider will ask you about your medical history  and perform a physical exam. A chest X-ray or blood testing may be performed to look for other causes of your symptoms or other conditions that may have triggered your asthma attack. TREATMENT  Treatment is aimed at reducing inflammation and opening up the airways in your lungs. Most asthma attacks are treated with inhaled medicines. These include quick relief or rescue medicines (such as bronchodilators) and controller medicines (such as inhaled corticosteroids). These medicines are sometimes given through an inhaler or a nebulizer. Systemic steroid medicine taken by mouth or given through an IV tube also can be used to reduce the inflammation when an attack is moderate or severe. Antibiotic medicines are only used if a bacterial infection is present.  HOME CARE INSTRUCTIONS   Rest.  Drink plenty of liquids. This helps the mucus to remain thin and be easily coughed up. Only use caffeine in moderation and do not use alcohol until you have recovered from your illness.  Do not smoke. Avoid being exposed to secondhand smoke.  You play a critical role in keeping yourself in good health. Avoid exposure to things that cause you to wheeze or to have breathing problems.  Keep your medicines up-to-date and available. Carefully follow your health care provider's treatment plan.  Take your medicine exactly as prescribed.  When pollen or pollution is bad, keep windows closed and use an air conditioner or go to places with air conditioning.  Asthma requires careful medical care. See your health care provider for a follow-up as advised. If you are more than [redacted] weeks pregnant and you were prescribed any new medicines, let your obstetrician know about the visit and  how you are doing. Follow up with your health care provider as directed.  After you have recovered from your asthma attack, make an appointment with your outpatient doctor to talk about ways to reduce the likelihood of future attacks. If you do not  have a doctor who manages your asthma, make an appointment with a primary care doctor to discuss your asthma. SEEK IMMEDIATE MEDICAL CARE IF:   You are getting worse.  You have trouble breathing. If severe, call your local emergency services (911 in the [place]  You develop chest pain or discomfort.  You are vomiting.  You are not able to keep fluids down.  You are coughing up yellow, green, brown, or bloody sputum.  You have a fever and your symptoms suddenly get worse.  You have trouble swallowing. MAKE SURE YOU:   Understand these instructions.  Will watch your condition.  Will get help right away if you are not doing well or get worse. Document Released: 12/05/2006 Document Revised: 08/25/2013 Document Reviewed: 02/25/2013 [facility] Patient Information 2015 [place], [place]. This information is not intended to replace advice given to you by your health care provider. Make sure you discuss any questions you have with your health care provider.

## 2014-05-05 NOTE — ED Notes (Signed)
C/o sneezing onset Sat. 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 Sore throat - hurt to swallow and cough onset Monday.  C/o nasal stuffiness and runny at the same time.  C/o pressure in her head and feels like her head is going to explode.  Cough is prod. of clear sputum with occ. yellowish green sputum in it.  No fever.

## 2014-05-05 NOTE — ED Provider Notes (Addendum)
CSN: [id number]     Arrival date & time 05/05/14  1716 History   First MD Initiated Contact with Patient 05/05/14 1743     Chief Complaint  Patient presents with  . URI   (Consider location/radiation/quality/duration/timing/severity/associated sxs/prior Treatment) HPI  Runny nose and nasal congestion: Started on Saturday. Off loaded a lot of supplies from tractor trailer on Saturday. Lots of dust. Sore throat and cough started Sunday night and Monday. Wheezing started on Monday. No albuterol at home. Out of symbicort. Denies fevers, SOB, CP. Some difficulty breathing. No sick contacts.    Past Medical History  Diagnosis Date  . Allergy   . Asthma   . IUD (intrauterine device) in place     no longer has IUD- removed in 2015  . Bronchitis, acute    History reviewed. No pertinent past surgical history. Family History  Problem Relation Age of Onset  . Asthma Mother   . Diabetes Father   . Hyperlipidemia Father   . Hypertension Father   . Bronchitis Father    History  Substance Use Topics  . Smoking status: Former Smoker    Types: Cigarettes    Quit date: 09/04/2007  . Smokeless tobacco: Never Used  . Alcohol Use: No   OB History   Grav Para Term Preterm Abortions TAB SAB Ect Mult Living   Review of Systems  Allergies  Review of patient's allergies indicates no known allergies.  Home Medications   Prior to Admission medications   Medication Sig Start Date End Date Taking? Authorizing Provider  Ascorbic Acid (VITAMIN C) 1000 MG tablet Take 1,000 mg by mouth daily.   Yes Historical Provider, MD  budesonide-formoterol (SYMBICORT) 160-4.5 MCG/ACT inhaler Inhale 2 puffs into the lungs 2 (two) times daily. 10/26/13  Yes Kermit Balo Tysinger, PA-C  DM-Phenylephrine-Acetaminophen [facility] SEVERE COLD PO) Take by mouth.   Yes Historical Provider, MD  albuterol (PROVENTIL HFA;VENTOLIN HFA) 108 (90 BASE) MCG/ACT inhaler Inhale 2 puffs into the lungs every 6 (six)  hours as needed for wheezing or shortness of breath. 05/05/14   Ozella Rocks, MD  albuterol (PROVENTIL) (2.5 MG/3ML) 0.083% nebulizer solution Take 3 mLs (2.5 mg total) by nebulization every 6 (six) hours as needed for wheezing or shortness of breath. 10/26/13   Kermit Balo Tysinger, PA-C  amoxicillin-clavulanate (AUGMENTIN) 875-125 MG per tablet Take 1 tablet by mouth every 12 (twelve) hours. 01/08/14   Riki Sheer, PA-C  fluticasone (FLONASE) 50 MCG/ACT nasal spray Place 1-2 sprays into both nostrils daily. 05/05/14   Ozella Rocks, MD  ibuprofen (ADVIL,MOTRIN) 200 MG tablet Take 200 mg by mouth every 6 (six) hours as needed.    Historical Provider, MD  norgestimate-ethinyl estradiol (ORTHO-CYCLEN,SPRINTEC,PREVIFEM) 0.25-35 MG-MCG tablet Take 1 tablet by mouth daily. 08/20/13   Willodean Rosenthal, MD  predniSONE (DELTASONE) 50 MG tablet Take daily with breakfast 05/05/14   Ozella Rocks, MD   BP 116/65  Pulse 83  Temp(Src) 97.7 F (36.5 C) (Oral)  Resp 16  LMP 04/27/2014 Physical Exam  Constitutional: She is oriented to person, place, and time. She appears well-developed and well-nourished.  HENT:  Head: Normocephalic.  Boggy nasal turbinates bilat  Eyes: EOM are normal. Pupils are equal, round, and reactive to light.  Neck: Normal range of motion.  Cardiovascular: Normal rate, normal heart sounds and intact distal pulses.   No murmur heard. Pulmonary/Chest: Effort normal and breath sounds  normal.  Wheezing throughout.  Good air movement No consolidation.   Abdominal: Soft.  Musculoskeletal: Normal range of motion. She exhibits no tenderness.  Neurological: She is alert and oriented to person, place, and time.  Skin: Skin is warm and dry.  Psychiatric: She has a normal mood and affect. Judgment and thought content normal.   O2 sat 100%   ED Course  Procedures (including critical care time) Labs Review Labs Reviewed - No data to display  Imaging Review No results  found.   MDM   1. Asthma with acute exacerbation, unspecified asthma severity   2. Allergic rhinitis, unspecified allergic rhinitis type    Solumedrol 125 IM in office [place]oneb hurricane spray  Improved after treatments  Start prednisone 50 Qday x 5 days, albuterol Q4 x 2-3 days, start flonase when done w/ prednisone., Start nasal saline, start zyrtec.   Precautions given and all questions answered  Shelly Flatten, MD Family Medicine 05/05/2014, 6:08 PM      Ozella Rocks, MD 05/05/14 1610  Ozella Rocks, MD 05/05/14 [phone]

## 2014-05-05 NOTE — Telephone Encounter (Signed)
Pt called and stated she was having issues with her asthma and didn't have the money to buy medication. She is requesting samples of Albuteral and symbicort. Please call pt and inform.

## 2014-05-06 NOTE — Telephone Encounter (Signed)
Yes, give sample of Symbicort.    What I don't understand is that I haven't seen her since early in the year, but she went to Urgent Care twice recently.  Why did she not come here?  Urgent Care tends to be more expensive.  It is better for overall continuity of care for her to be seen here, and if a medication is too expensive, we can work to find options.    Please have her f/u here in 2-3 wk after begin back on Symbicort.

## 2014-05-06 NOTE — Telephone Encounter (Signed)
Sherry Ferguson, we have sample of Symbicort, may I provide it to patient?

## 2014-05-06 NOTE — Telephone Encounter (Signed)
LM to [facility] 

## 2014-05-06 NOTE — Telephone Encounter (Signed)
Spoke with patient, advised her to follow up here in 2-3 weeks after being on Symbicort per [place]. Patient agrees to schedule, sample provided to patient.

## 2014-05-17 ENCOUNTER — Telehealth: Payer: Self-pay | Admitting: Family Medicine

## 2014-05-17 NOTE — Telephone Encounter (Signed)
ER letter sent 

## 2014-05-31 ENCOUNTER — Encounter: Payer: Self-pay | Admitting: Obstetrics & Gynecology

## 2014-05-31 ENCOUNTER — Other Ambulatory Visit: Payer: Self-pay

## 2014-05-31 DIAGNOSIS — Z3481 Encounter for supervision of other normal pregnancy, first trimester: Secondary | ICD-10-CM

## 2014-05-31 LAB — POCT PREGNANCY, URINE: Preg Test, Ur: POSITIVE — AB

## 2014-05-31 NOTE — Progress Notes (Signed)
Pt here for pregnancy test resulting positive.  Pt states that she would like to come here for her prenatal care.  Pt informed me that she thinks that her LMP was 04/30/14 but that she has irregular periods.  Scheduled [place] for dating on .  Pt completed one hour glucose tolerance due to BMI > 30.  Inititial OB labs drawn today.  Pt will need pap smear on inititial ob visit. Pt asked about breast tenderness because she has not had it in her other pregnancies.  I advised pt that it is a normal symptom in pregnancy and that maybe if warm shower may be helpful.  The provider can evaluate it on her visit. Pt stated understanding.

## 2014-06-01 LAB — CULTURE, OB URINE
COLONY COUNT: NO GROWTH
Organism ID, Bacteria: NO GROWTH

## 2014-06-01 LAB — HIV ANTIBODY (ROUTINE TESTING W REFLEX): HIV: NONREACTIVE

## 2014-06-01 LAB — GLUCOSE TOLERANCE, 1 HOUR (50G) W/O FASTING: Glucose, 1 Hour GTT: 86 mg/dL (ref 70–140)

## 2014-06-02 LAB — PRESCRIPTION MONITORING PROFILE (19 PANEL)
AMPHETAMINE/METH: NEGATIVE ng/mL
BARBITURATE SCREEN, URINE: NEGATIVE ng/mL
BENZODIAZEPINE SCREEN, URINE: NEGATIVE ng/mL
BUPRENORPHINE, URINE: NEGATIVE ng/mL
CANNABINOID SCRN UR: NEGATIVE ng/mL
COCAINE METABOLITES: NEGATIVE ng/mL
CREATININE, URINE: 154.79 mg/dL (ref >20.0)
Carisoprodol, Urine: NEGATIVE ng/mL
FENTANYL URINE: NEGATIVE ng/mL
MDMA URINE: NEGATIVE ng/mL
METHAQUALONE SCREEN (URINE): NEGATIVE ng/mL
Meperidine, Ur: NEGATIVE ng/mL
Methadone Screen, Urine: NEGATIVE ng/mL
Nitrites, Initial: NEGATIVE ug/mL
OPIATE SCREEN, URINE: NEGATIVE ng/mL
Oxycodone Screen, Ur: NEGATIVE ng/mL
PHENCYCLIDINE, UR: NEGATIVE ng/mL
Propoxyphene: NEGATIVE ng/mL
TAPENTADOLUR: NEGATIVE ng/mL
Tramadol Scrn, Ur: NEGATIVE ng/mL
ZOLPIDEM, URINE: NEGATIVE ng/mL
pH, Initial: 6.7 pH (ref 4.5–8.9)

## 2014-06-02 LAB — HEMOGLOBINOPATHY EVALUATION
HGB A2 QUANT: 3 % (ref 2.2–3.2)
HGB A: 97 % (ref 96.8–97.8)
HGB F QUANT: 0 % (ref 0.0–2.0)
Hemoglobin Other: 0 %
Hgb S Quant: 0 %

## 2014-06-03 ENCOUNTER — Ambulatory Visit (HOSPITAL_COMMUNITY)
Admission: RE | Admit: 2014-06-03 | Discharge: 2014-06-03 | Disposition: A | Discharge status: Home / Self Care | Payer: Medicaid Other | Source: Ambulatory Visit | Attending: Obstetrics and Gynecology | Admitting: Obstetrics and Gynecology

## 2014-06-03 DIAGNOSIS — Z3481 Encounter for supervision of other normal pregnancy, first trimester: Secondary | ICD-10-CM

## 2014-06-03 DIAGNOSIS — Z3491 Encounter for supervision of normal pregnancy, unspecified, first trimester: Secondary | ICD-10-CM | POA: Insufficient documentation

## 2014-06-04 LAB — OBSTETRIC PANEL
ANTIBODY SCREEN: NEGATIVE
BASOS ABS: 0 10*3/uL (ref 0.0–0.1)
BASOS PCT: 0 % (ref 0–1)
EOS PCT: 5 % (ref 0–5)
Eosinophils Absolute: 0.3 10*3/uL (ref 0.0–0.7)
HEMATOCRIT: 38.5 % (ref 36.0–46.0)
Hemoglobin: 12.2 g/dL (ref 12.0–15.0)
Hepatitis B Surface Ag: NEGATIVE
LYMPHS PCT: 35 % (ref 12–46)
Lymphs Abs: 1.9 10*3/uL (ref 0.7–4.0)
MCH: 27.5 pg (ref 26.0–34.0)
MCHC: 31.7 g/dL (ref 30.0–36.0)
MCV: 86.9 fL (ref 78.0–100.0)
MONO ABS: 0.4 10*3/uL (ref 0.1–1.0)
Monocytes Relative: 8 % (ref 3–12)
NEUTROS ABS: 2.9 10*3/uL (ref 1.7–7.7)
Neutrophils Relative %: 52 % (ref 43–77)
PLATELETS: 287 10*3/uL (ref 150–400)
RBC: 4.43 MIL/uL (ref 3.87–5.11)
RDW: 14.5 % (ref 11.5–15.5)
RUBELLA: 1.08 {index} — AB (ref <0.90)
Rh Type: POSITIVE
WBC: 5.5 10*3/uL (ref 4.0–10.5)

## 2014-06-08 ENCOUNTER — Telehealth: Payer: Self-pay | Admitting: *Deleted

## 2014-06-08 DIAGNOSIS — N939 Abnormal uterine and vaginal bleeding, unspecified: Secondary | ICD-10-CM

## 2014-06-08 NOTE — Telephone Encounter (Signed)
Sherry Ferguson called front desk and asked for someone to call her with results. Called Sherry Ferguson and she wanted her ob lab results and ultrasound results. I notified her ob labs were within normal limits. I notified her ultrasound showed that she was early pregnant but did not see all the markers to confirm is a healthy pregnancy yet and reccomendation was another blood test or another ultrasound and that I would forward to Dr. Jolayne Pantheronstant and we would call her when we have orders from the physician.

## 2014-06-10 NOTE — Telephone Encounter (Signed)
Contacted patient, informed of follow ultrasound scheduled for 06/24/14 at 1115.  Pt verbalizes understanding.

## 2014-06-10 NOTE — Telephone Encounter (Signed)
Message copied by Dorothyann PengHAIZLIP, Kareem Aul E on Thu Jun 10, 2014  9:28 AM ------      Message from: CONSTANT, PEGGY      Created: Wed Jun 09, 2014  3:38 PM       Please schedule follow up ultrasound in 14 days for viability            Thanks            Peggy ------

## 2014-06-24 ENCOUNTER — Ambulatory Visit (HOSPITAL_COMMUNITY)
Admission: RE | Admit: 2014-06-24 | Discharge: 2014-06-24 | Disposition: A | Discharge status: Home / Self Care | Payer: Medicaid Other | Source: Ambulatory Visit | Attending: Obstetrics and Gynecology | Admitting: Obstetrics and Gynecology

## 2014-06-24 ENCOUNTER — Other Ambulatory Visit: Payer: Self-pay | Admitting: Obstetrics and Gynecology

## 2014-06-24 DIAGNOSIS — N939 Abnormal uterine and vaginal bleeding, unspecified: Secondary | ICD-10-CM

## 2014-06-24 DIAGNOSIS — N8320 Unspecified ovarian cysts: Secondary | ICD-10-CM | POA: Diagnosis not present

## 2014-06-24 DIAGNOSIS — O4691 Antepartum hemorrhage, unspecified, first trimester: Secondary | ICD-10-CM | POA: Diagnosis present

## 2014-06-24 DIAGNOSIS — Z3A08 8 weeks gestation of pregnancy: Secondary | ICD-10-CM | POA: Insufficient documentation

## 2014-06-24 DIAGNOSIS — O468X1 Other antepartum hemorrhage, first trimester: Secondary | ICD-10-CM | POA: Diagnosis not present

## 2014-06-24 DIAGNOSIS — O3481 Maternal care for other abnormalities of pelvic organs, first trimester: Secondary | ICD-10-CM | POA: Diagnosis not present

## 2014-07-05 ENCOUNTER — Encounter (HOSPITAL_COMMUNITY): Payer: Self-pay | Admitting: Emergency Medicine

## 2014-07-07 ENCOUNTER — Other Ambulatory Visit: Payer: Self-pay | Admitting: Obstetrics & Gynecology

## 2014-07-07 ENCOUNTER — Encounter: Payer: Self-pay | Admitting: Advanced Practice Midwife

## 2014-07-07 ENCOUNTER — Ambulatory Visit (INDEPENDENT_AMBULATORY_CARE_PROVIDER_SITE_OTHER): Payer: Self-pay | Admitting: Advanced Practice Midwife

## 2014-07-07 DIAGNOSIS — Z369 Encounter for antenatal screening, unspecified: Secondary | ICD-10-CM

## 2014-07-07 DIAGNOSIS — O418X10 Other specified disorders of amniotic fluid and membranes, first trimester, not applicable or unspecified: Secondary | ICD-10-CM

## 2014-07-07 DIAGNOSIS — O468X1 Other antepartum hemorrhage, first trimester: Secondary | ICD-10-CM

## 2014-07-07 LAB — POCT URINALYSIS DIP (DEVICE)
BILIRUBIN URINE: NEGATIVE
GLUCOSE, UA: NEGATIVE mg/dL
HGB URINE DIPSTICK: NEGATIVE
Ketones, ur: NEGATIVE mg/dL
Leukocytes, UA: NEGATIVE
NITRITE: NEGATIVE
Protein, ur: NEGATIVE mg/dL
Specific Gravity, Urine: >=1.03 (ref 1.005–1.030)
UROBILINOGEN UA: 1 mg/dL (ref 0.0–1.0)
pH: 6 (ref 5.0–8.0)

## 2014-07-07 MED ORDER — VITAMIN B-6 50 MG PO TABS: 50.0000 mg | ORAL_TABLET | Freq: Every day | ORAL | Status: DC

## 2014-07-07 MED ORDER — DOXYLAMINE SUCCINATE (SLEEP) 25 MG PO TABS: 25.0000 mg | ORAL_TABLET | Freq: Every evening | ORAL | Status: DC | PRN

## 2014-07-07 NOTE — Patient Instructions (Signed)
First Trimester of Pregnancy The first trimester of pregnancy is from week 1 until the end of week 12 (months 1 through 3). A week after a sperm fertilizes an egg, the egg will implant on the wall of the uterus. This embryo will begin to develop into a baby. Genes from you and your partner are forming the baby. The female genes determine whether the baby is a boy or a girl. At 6-8 weeks, the eyes and face are formed, and the heartbeat can be seen on ultrasound. At the end of 12 weeks, all the baby's organs are formed.  Now that you are pregnant, you will want to do everything you can to have a healthy baby. Two of the most important things are to get good prenatal care and to follow your health care provider's instructions. Prenatal care is all the medical care you receive before the baby's birth. This care will help prevent, find, and treat any problems during the pregnancy and childbirth. BODY CHANGES Your body goes through many changes during pregnancy. The changes vary from woman to woman.   You may gain or lose a couple of pounds at first.  You may feel sick to your stomach (nauseous) and throw up (vomit). If the vomiting is uncontrollable, call your health care provider.  You may tire easily.  You may develop headaches that can be relieved by medicines approved by your health care provider.  You may urinate more often. Painful urination may mean you have a bladder infection.  You may develop heartburn as a result of your pregnancy.  You may develop constipation because certain hormones are causing the muscles that push waste through your intestines to slow down.  You may develop hemorrhoids or swollen, bulging veins (varicose veins).  Your breasts may begin to grow larger and become tender. Your nipples may stick out more, and the tissue that surrounds them (areola) may become darker.  Your gums may bleed and may be sensitive to brushing and flossing.  Dark spots or blotches (chloasma,  mask of pregnancy) may develop on your face. This will likely fade after the baby is born.  Your menstrual periods will stop.  You may have a loss of appetite.  You may develop cravings for certain kinds of food.  You may have changes in your emotions from day to day, such as being excited to be pregnant or being concerned that something may go wrong with the pregnancy and baby.  You may have more vivid and strange dreams.  You may have changes in your hair. These can include thickening of your hair, rapid growth, and changes in texture. Some women also have hair loss during or after pregnancy, or hair that feels dry or thin. Your hair will most likely return to normal after your baby is born. WHAT TO EXPECT AT YOUR PRENATAL VISITS During a routine prenatal visit:  You will be weighed to make sure you and the baby are growing normally.  Your blood pressure will be taken.  Your abdomen will be measured to track your baby's growth.  The fetal heartbeat will be listened to starting around week 10 or 12 of your pregnancy.  Test results from any previous visits will be discussed. Your health care provider may ask you:  How you are feeling.  If you are feeling the baby move.  If you have had any abnormal symptoms, such as leaking fluid, bleeding, severe headaches, or abdominal cramping.  If you have any questions. Other tests   that may be performed during your first trimester include:  Blood tests to find your blood type and to check for the presence of any previous infections. They will also be used to check for low iron levels (anemia) and Rh antibodies. Later in the pregnancy, blood tests for diabetes will be done along with other tests if problems develop.  Urine tests to check for infections, diabetes, or protein in the urine.  An ultrasound to confirm the proper growth and development of the baby.  An amniocentesis to check for possible genetic problems.  Fetal screens for  spina bifida and Down syndrome.  You may need other tests to make sure you and the baby are doing well. HOME CARE INSTRUCTIONS  Medicines  Follow your health care provider's instructions regarding medicine use. Specific medicines may be either safe or unsafe to take during pregnancy.  Take your prenatal vitamins as directed.  If you develop constipation, try taking a stool softener if your health care provider approves. Diet  Eat regular, well-balanced meals. Choose a variety of foods, such as meat or vegetable-based protein, fish, milk and low-fat dairy products, vegetables, fruits, and whole grain breads and cereals. Your health care provider will help you determine the amount of weight gain that is right for you.  Avoid raw meat and uncooked cheese. These carry germs that can cause birth defects in the baby.  Eating four or five small meals rather than three large meals a day may help relieve nausea and vomiting. If you start to feel nauseous, eating a few soda crackers can be helpful. Drinking liquids between meals instead of during meals also seems to help nausea and vomiting.  If you develop constipation, eat more high-fiber foods, such as fresh vegetables or fruit and whole grains. Drink enough fluids to keep your urine clear or pale yellow. Activity and Exercise  Exercise only as directed by your health care provider. Exercising will help you:  Control your weight.  Stay in shape.  Be prepared for labor and delivery.  Experiencing pain or cramping in the lower abdomen or low back is a good sign that you should stop exercising. Check with your health care provider before continuing normal exercises.  Try to avoid standing for long periods of time. Move your legs often if you must stand in one place for a long time.  Avoid heavy lifting.  Wear low-heeled shoes, and practice good posture.  You may continue to have sex unless your health care provider directs you  otherwise. Relief of Pain or Discomfort  Wear a good support bra for breast tenderness.   Take warm sitz baths to soothe any pain or discomfort caused by hemorrhoids. Use hemorrhoid cream if your health care provider approves.   Rest with your legs elevated if you have leg cramps or low back pain.  If you develop varicose veins in your legs, wear support hose. Elevate your feet for 15 minutes, 3-4 times a day. Limit salt in your diet. Prenatal Care  Schedule your prenatal visits by the twelfth week of pregnancy. They are usually scheduled monthly at first, then more often in the last 2 months before delivery.  Write down your questions. Take them to your prenatal visits.  Keep all your prenatal visits as directed by your health care provider. Safety  Wear your seat belt at all times when driving.  Make a list of emergency phone numbers, including numbers for family, friends, the hospital, and police and fire departments. General Tips    Ask your health care provider for a referral to a local prenatal education class. Begin classes no later than at the beginning of month 6 of your pregnancy.  Ask for help if you have counseling or nutritional needs during pregnancy. Your health care provider can offer advice or refer you to specialists for help with various needs.  Do not use hot tubs, steam rooms, or saunas.  Do not douche or use tampons or scented sanitary pads.  Do not cross your legs for long periods of time.  Avoid cat litter boxes and soil used by cats. These carry germs that can cause birth defects in the baby and possibly loss of the fetus by miscarriage or stillbirth.  Avoid all smoking, herbs, alcohol, and medicines not prescribed by your health care provider. Chemicals in these affect the formation and growth of the baby.  Schedule a dentist appointment. At home, brush your teeth with a soft toothbrush and be gentle when you floss. SEEK MEDICAL CARE IF:   You have  dizziness.  You have mild pelvic cramps, pelvic pressure, or nagging pain in the abdominal area.  You have persistent nausea, vomiting, or diarrhea.  You have a bad smelling vaginal discharge.  You have pain with urination.  You notice increased swelling in your face, hands, legs, or ankles. SEEK IMMEDIATE MEDICAL CARE IF:   You have a fever.  You are leaking fluid from your vagina.  You have spotting or bleeding from your vagina.  You have severe abdominal cramping or pain.  You have rapid weight gain or loss.  You vomit blood or material that looks like coffee grounds.  You are exposed to [place] measles and have never had them.  You are exposed to fifth disease or chickenpox.  You develop a severe headache.  You have shortness of breath.  You have any kind of trauma, such as from a fall or a car accident. Document Released: 08/14/2001 Document Revised: 01/04/2014 Document Reviewed: 06/30/2013 [facility] Patient Information 2015 [place], [place]. This information is not intended to replace advice given to you by your health care provider. Make sure you discuss any questions you have with your health care provider. AFP Maternal This is a routine screen (tests) used to check for fetal abnormalities such as Down syndrome and neural tube defects. Down syndrome is a chromosomal abnormality, sometimes called Trisomy 4621. Neural tube defects are serious birth defects. The brain, spinal cord, or their coverings do not develop completely. Women should be tested in the 15th to 20th week of pregnancy. The msAFP screen involves three or four tests that measure substances found in the blood that make the testing better. During development, AFP levels in fetal blood and amniotic fluid rise until about 12 weeks. The levels then gradually fall until birth. AFP is a protein produce by fetal tissue. AFP crosses the placenta and appears in the maternal blood. A baby with an open neural tube defect has  an opening in its spine, head, or abdominal wall that allows higher than usual amounts of AFP to pass into the mother's blood. If a screen is positive, more tests are needed to make a diagnosis. These include ultrasound and perhaps amniocentesis (checking the fluid that surrounds the baby). These tests are used to help women and their caregivers make decisions about the management of their pregnancies. In pregnancies where the fetus is carrying the chromosomal defect that results in Down syndrome, the levels of AFP and unconjugated estriol tend to be low and  hCG and inhibin A levels high.  PREPARATION FOR TEST Blood is drawn from a vein in your arm usually between the 15th and 20th weeks of pregnancy. Four different tests on your blood are done. These are AFP, hCG, unconjugated estriol, and inhibin A. The combination of tests produces a more accurate result. NORMAL FINDINGS   Adult: less than 40 ng/mL or less than 40 mg/L (SI units).  Child younger than 1 year: less than 30 ng/mL. Ranges are stratified by weeks of gestation and vary among laboratories. Ranges for normal findings may vary among different laboratories and hospitals. You should always check with your doctor after having lab work or other tests done to discuss the meaning of your test results and whether your values are considered within normal limits. MEANING OF TEST  These are screening tests. Not all fetal abnormalities will give positive test results. Of all women who have positive AFP screening results, only a very small number of them have babies who actually have a neural tube defect or chromosomal abnormality. Your caregiver will go over the test results with you and discuss the importance and meaning of your results, as well as treatment options and the need for additional tests if necessary. OBTAINING THE TEST RESULTS It is your responsibility to obtain your test results. Ask the lab or department performing the test when and  how you will get your results. Document Released: 09/11/2004 Document Revised: 01/04/2014 Document Reviewed: 07/24/2008 [facility] Patient Information 2015 [place], [place]. This information is not intended to replace advice given to you by your health care provider. Make sure you discuss any questions you have with your health care provider.

## 2014-07-07 NOTE — Progress Notes (Signed)
New OB visit. Routines reviewed. See SmartSet  Subjective:    Sherry Ferguson is a [id number] 2090w1d being seen today for her first obstetrical visit.  Her obstetrical history is significant for obesity. Patient does intend to breast feed. Pregnancy history fully reviewed.  Patient reports nausea.  Filed Vitals:   07/07/14 0922  BP: 95/54  Pulse: 78  Temp: 98.5 F (36.9 C)  Weight: 242 lb (109.77 kg)    HISTORY: OB History  Gravida Para Term Preterm AB SAB TAB Ectopic Multiple Living  3 2 2       2     # Outcome Date GA Lbr Len/2nd Weight Sex Delivery Anes PTL Lv  3 Current           2 Term 02/10/09 2614w0d   F Vag-Spont     1 Term 12/08/05 3514w0d   F Vag-Spont        Past Medical History  Diagnosis Date  . Allergy   . Asthma   . IUD (intrauterine device) in place     no longer has IUD- removed in 2015  . Bronchitis, acute    History reviewed. No pertinent past surgical history. Family History  Problem Relation Age of Onset  . Asthma Mother   . Diabetes Father   . Hyperlipidemia Father   . Hypertension Father   . Bronchitis Father      Exam    Uterus:  Fundal Height: 10 cm  Pelvic Exam:    Perineum: No Hemorrhoids, Normal Perineum   Vulva: Bartholin's, Urethra, Skene's normal   Vagina:  normal mucosa, normal discharge   pH:    Cervix: no bleeding following Pap and no cervical motion tenderness   Adnexa: normal adnexa and no mass, fullness, tenderness   Bony Pelvis: gynecoid  System: Breast:  normal appearance, no masses or tenderness   Skin: normal coloration and turgor, no rashes    Neurologic: oriented, grossly non-focal   Extremities: normal strength, tone, and muscle mass   HEENT neck supple with midline trachea   Mouth/Teeth mucous membranes moist, pharynx normal without lesions   Neck supple and no masses   Cardiovascular: regular rate and rhythm, no murmurs or gallops   Respiratory:  appears well, vitals normal, no respiratory distress, acyanotic,  normal RR, ear and throat exam is normal, neck free of mass or lymphadenopathy, chest clear, no wheezing, crepitations, rhonchi, normal symmetric air entry   Abdomen: soft, non-tender; bowel sounds normal; no masses,  no organomegaly   Urinary: urethral meatus normal      Assessment:    Pregnancy: [id number] Patient Active Problem List   Diagnosis Date Noted  . Subchorionic hemorrhage in first trimester 07/07/2014  . Acute bronchitis 09/22/2012  . Acute respiratory failure 09/22/2012  . Hypokalemia 09/22/2012  . Asthma with acute exacerbation 09/21/2012  . Obesity (BMI 30-39.9) 02/01/2011  . Asthma with allergic rhinitis 02/01/2011        Plan:     Initial labs drawn. Prenatal vitamins. Problem list reviewed and updated. Genetic Screening discussed First Screen: ordered.  Ultrasound discussed; fetal survey: ordered.  Follow up in 4 weeks. 50% of 30 min visit spent on counseling and coordination of care.   Pap done with cultures   [facility] CenterWILLIAMS,Sherry Vecchione 07/07/2014

## 2014-07-07 NOTE — Progress Notes (Signed)
Reports dull aching pain in stomach. C/o of nausea all day long.  Pap today.  Discussed flu vaccine-- patient declines.

## 2014-07-08 LAB — CYTOLOGY - PAP

## 2014-07-23 ENCOUNTER — Ambulatory Visit (HOSPITAL_COMMUNITY)
Admission: RE | Admit: 2014-07-23 | Discharge: 2014-07-23 | Disposition: A | Discharge status: Home / Self Care | Payer: Medicaid Other | Source: Ambulatory Visit | Attending: Obstetrics & Gynecology | Admitting: Obstetrics & Gynecology

## 2014-07-23 DIAGNOSIS — Z3682 Encounter for antenatal screening for nuchal translucency: Secondary | ICD-10-CM | POA: Insufficient documentation

## 2014-07-23 DIAGNOSIS — Z3A12 12 weeks gestation of pregnancy: Secondary | ICD-10-CM | POA: Insufficient documentation

## 2014-07-23 DIAGNOSIS — Z36 Encounter for antenatal screening of mother: Secondary | ICD-10-CM | POA: Diagnosis not present

## 2014-07-23 DIAGNOSIS — Z369 Encounter for antenatal screening, unspecified: Secondary | ICD-10-CM | POA: Insufficient documentation

## 2014-08-02 LAB — SCREEN, FIRST TRIMESTER, SERUM: FIRST TRIMESTER SAMPLE: NEGATIVE

## 2014-08-04 ENCOUNTER — Encounter: Payer: Self-pay | Admitting: Obstetrics and Gynecology

## 2014-08-04 ENCOUNTER — Other Ambulatory Visit (HOSPITAL_COMMUNITY): Payer: Self-pay

## 2014-08-05 ENCOUNTER — Ambulatory Visit (INDEPENDENT_AMBULATORY_CARE_PROVIDER_SITE_OTHER): Payer: Self-pay | Admitting: Physician Assistant

## 2014-08-05 VITALS — BP 103/66 | HR 91 | Temp 98.2°F | Wt 246.9 lb

## 2014-08-05 DIAGNOSIS — Z3492 Encounter for supervision of normal pregnancy, unspecified, second trimester: Secondary | ICD-10-CM

## 2014-08-05 LAB — POCT URINALYSIS DIP (DEVICE)
Bilirubin Urine: NEGATIVE
Glucose, UA: NEGATIVE mg/dL
HGB URINE DIPSTICK: NEGATIVE
Ketones, ur: NEGATIVE mg/dL
Nitrite: NEGATIVE
PROTEIN: NEGATIVE mg/dL
UROBILINOGEN UA: 0.2 mg/dL (ref 0.0–1.0)
pH: 6 (ref 5.0–8.0)

## 2014-08-05 NOTE — Progress Notes (Signed)
14 weeks, stable without complaint.  No fetal movement yet.  Denies Vaginal bleeding, LOF, dysuria.  Results reviewed with pt from screening/ultrasound Okay to use Tums for heartburn/zantac if tums are not sufficient If no improvement in symptoms, return to clinic OBF in 4 weeks MSAFP today

## 2014-08-05 NOTE — Patient Instructions (Signed)
Second Trimester of Pregnancy The second trimester is from week 13 through week 28, months 4 through 6. The second trimester is often a time when you feel your best. Your body has also adjusted to being pregnant, and you begin to feel better physically. Usually, morning sickness has lessened or quit completely, you may have more energy, and you may have an increase in appetite. The second trimester is also a time when the fetus is growing rapidly. At the end of the sixth month, the fetus is about 9 inches long and weighs about 1 pounds. You will likely begin to feel the baby move (quickening) between 18 and 20 weeks of the pregnancy. BODY CHANGES Your body goes through many changes during pregnancy. The changes vary from woman to woman.   Your weight will continue to increase. You will notice your lower abdomen bulging out.  You may begin to get stretch marks on your hips, abdomen, and breasts.  You may develop headaches that can be relieved by medicines approved by your health care provider.  You may urinate more often because the fetus is pressing on your bladder.  You may develop or continue to have heartburn as a result of your pregnancy.  You may develop constipation because certain hormones are causing the muscles that push waste through your intestines to slow down.  You may develop hemorrhoids or swollen, bulging veins (varicose veins).  You may have back pain because of the weight gain and pregnancy hormones relaxing your joints between the bones in your pelvis and as a result of a shift in weight and the muscles that support your balance.  Your breasts will continue to grow and be tender.  Your gums may bleed and may be sensitive to brushing and flossing.  Dark spots or blotches (chloasma, mask of pregnancy) may develop on your face. This will likely fade after the baby is born.  A dark line from your belly button to the pubic area (linea nigra) may appear. This will likely fade  after the baby is born.  You may have changes in your hair. These can include thickening of your hair, rapid growth, and changes in texture. Some women also have hair loss during or after pregnancy, or hair that feels dry or thin. Your hair will most likely return to normal after your baby is born. WHAT TO EXPECT AT YOUR PRENATAL VISITS During a routine prenatal visit:  You will be weighed to make sure you and the fetus are growing normally.  Your blood pressure will be taken.  Your abdomen will be measured to track your baby's growth.  The fetal heartbeat will be listened to.  Any test results from the previous visit will be discussed. Your health care provider may ask you:  How you are feeling.  If you are feeling the baby move.  If you have had any abnormal symptoms, such as leaking fluid, bleeding, severe headaches, or abdominal cramping.  If you have any questions. Other tests that may be performed during your second trimester include:  Blood tests that check for:  Low iron levels (anemia).  Gestational diabetes (between 24 and 28 weeks).  Rh antibodies.  Urine tests to check for infections, diabetes, or protein in the urine.  An ultrasound to confirm the proper growth and development of the baby.  An amniocentesis to check for possible genetic problems.  Fetal screens for spina bifida and Down syndrome. HOME CARE INSTRUCTIONS   Avoid all smoking, herbs, alcohol, and unprescribed   drugs. These chemicals affect the formation and growth of the baby.  Follow your health care provider's instructions regarding medicine use. There are medicines that are either safe or unsafe to take during pregnancy.  Exercise only as directed by your health care provider. Experiencing uterine cramps is a good sign to stop exercising.  Continue to eat regular, healthy meals.  Wear a good support bra for breast tenderness.  Do not use hot tubs, steam rooms, or saunas.  Wear your  seat belt at all times when driving.  Avoid raw meat, uncooked cheese, cat litter boxes, and soil used by cats. These carry germs that can cause birth defects in the baby.  Take your prenatal vitamins.  Try taking a stool softener (if your health care provider approves) if you develop constipation. Eat more high-fiber foods, such as fresh vegetables or fruit and whole grains. Drink plenty of fluids to keep your urine clear or pale yellow.  Take warm sitz baths to soothe any pain or discomfort caused by hemorrhoids. Use hemorrhoid cream if your health care provider approves.  If you develop varicose veins, wear support hose. Elevate your feet for 15 minutes, 3-4 times a day. Limit salt in your diet.  Avoid heavy lifting, wear low heel shoes, and practice good posture.  Rest with your legs elevated if you have leg cramps or low back pain.  Visit your dentist if you have not gone yet during your pregnancy. Use a soft toothbrush to brush your teeth and be gentle when you floss.  A sexual relationship may be continued unless your health care provider directs you otherwise.  Continue to go to all your prenatal visits as directed by your health care provider. SEEK MEDICAL CARE IF:   You have dizziness.  You have mild pelvic cramps, pelvic pressure, or nagging pain in the abdominal area.  You have persistent nausea, vomiting, or diarrhea.  You have a bad smelling vaginal discharge.  You have pain with urination. SEEK IMMEDIATE MEDICAL CARE IF:   You have a fever.  You are leaking fluid from your vagina.  You have spotting or bleeding from your vagina.  You have severe abdominal cramping or pain.  You have rapid weight gain or loss.  You have shortness of breath with chest pain.  You notice sudden or extreme swelling of your face, hands, ankles, feet, or legs.  You have not felt your baby move in over an hour.  You have severe headaches that do not go away with  medicine.  You have vision changes. Document Released: 08/14/2001 Document Revised: 08/25/2013 Document Reviewed: 10/21/2012 ExitCare Patient Information 2015 [employer]. This information is not intended to replace advice given to you by your health care provider. Make sure you discuss any questions you have with your health care provider.  

## 2014-08-05 NOTE — Progress Notes (Signed)
Pt is experiencing pain in upper abd for past 3 days

## 2014-08-06 LAB — ALPHA FETOPROTEIN, MATERNAL
AFP: 16.9 ng/mL
Curr Gest Age: 14.2 wks.days
MOM FOR AFP: 0.79
Open Spina bifida: NEGATIVE
Osb Risk: <1:54600 {titer}

## 2014-09-02 ENCOUNTER — Encounter: Payer: Self-pay | Admitting: Family

## 2014-09-03 NOTE — L&D Delivery Note (Cosign Needed)
Delivery Note At 6:58 AM a viable female was delivered via Vaginal, Spontaneous Delivery (Presentation: Left Occiput Anterior).  APGAR: 9, 9; weight 6 lb 15.1 oz (3150 g).   Placenta status: Intact, Spontaneous.  Cord: 3 vessels with the following complications: None.  Cord pH: n/a  Anesthesia: Epidural  Episiotomy: None Lacerations: None Suture Repair: n/a Est. Blood Loss (mL): 531  Mom to postpartum.  Baby to Couplet care / Skin to Skin.  Wyvonnia DuskyLAWSON, Artina Minella DARLENE 02/07/2015, 8:46 AM

## 2014-09-06 ENCOUNTER — Telehealth: Payer: Self-pay | Admitting: *Deleted

## 2014-09-06 NOTE — Telephone Encounter (Signed)
Pt called the nurse line stating she has a cold and is wondering what she can take over the counter to relieve symptoms.  Attempted to contact patient, no answer, left message stating that we can provide her with a list of over the counter medications to relieve her symptoms or she can obtain list at next prenatal visit.

## 2014-09-07 NOTE — Telephone Encounter (Signed)
Called patient, no answer- left message stating we are trying to return your phone call, if you still need assistance please call us back at the clinics

## 2014-09-09 ENCOUNTER — Encounter: Payer: Self-pay | Admitting: Family

## 2014-09-09 ENCOUNTER — Ambulatory Visit (INDEPENDENT_AMBULATORY_CARE_PROVIDER_SITE_OTHER): Payer: Self-pay | Admitting: Family

## 2014-09-09 ENCOUNTER — Ambulatory Visit (HOSPITAL_COMMUNITY)
Admission: RE | Admit: 2014-09-09 | Discharge: 2014-09-09 | Disposition: A | Discharge status: Home / Self Care | Payer: Medicaid Other | Source: Ambulatory Visit | Attending: Physician Assistant | Admitting: Physician Assistant

## 2014-09-09 VITALS — BP 99/68 | HR 83 | Wt 253.7 lb

## 2014-09-09 DIAGNOSIS — O468X1 Other antepartum hemorrhage, first trimester: Secondary | ICD-10-CM

## 2014-09-09 DIAGNOSIS — O208 Other hemorrhage in early pregnancy: Secondary | ICD-10-CM

## 2014-09-09 DIAGNOSIS — Z36 Encounter for antenatal screening of mother: Secondary | ICD-10-CM | POA: Diagnosis not present

## 2014-09-09 DIAGNOSIS — O99212 Obesity complicating pregnancy, second trimester: Secondary | ICD-10-CM | POA: Insufficient documentation

## 2014-09-09 DIAGNOSIS — Z3492 Encounter for supervision of normal pregnancy, unspecified, second trimester: Secondary | ICD-10-CM

## 2014-09-09 DIAGNOSIS — Z3A19 19 weeks gestation of pregnancy: Secondary | ICD-10-CM | POA: Diagnosis not present

## 2014-09-09 DIAGNOSIS — O418X10 Other specified disorders of amniotic fluid and membranes, first trimester, not applicable or unspecified: Secondary | ICD-10-CM

## 2014-09-09 LAB — POCT URINALYSIS DIP (DEVICE)
BILIRUBIN URINE: NEGATIVE
GLUCOSE, UA: NEGATIVE mg/dL
Hgb urine dipstick: NEGATIVE
Ketones, ur: NEGATIVE mg/dL
Leukocytes, UA: NEGATIVE
Nitrite: NEGATIVE
Protein, ur: NEGATIVE mg/dL
SPECIFIC GRAVITY, URINE: 1.025 (ref 1.005–1.030)
Urobilinogen, UA: 2 mg/dL — ABNORMAL HIGH (ref 0.0–1.0)
pH: 7 (ref 5.0–8.0)

## 2014-09-09 MED ORDER — HYDROXYZINE PAMOATE 25 MG PO CAPS: 25.0000 mg | ORAL_CAPSULE | Freq: Three times a day (TID) | ORAL | Status: DC | PRN

## 2014-09-09 NOTE — Progress Notes (Signed)
Pt reports rash that is all over her neck and chest. It itches and burns.  Pt also has cold, given otc meds sheet.

## 2014-09-09 NOTE — Progress Notes (Signed)
Anatomy ultrasound today, nml, limited views of heart > repeat in 4 wks.  AFP today.  Reports increased itching on abdomen, none on hands/feet.  No new soaps, detergents, clothes > try OTC benadryl.  RX vistaril if no improvement.  Increased groin pain with walking > advised belly band.

## 2014-09-10 ENCOUNTER — Encounter: Payer: Self-pay | Admitting: *Deleted

## 2014-09-13 LAB — AFP, QUAD SCREEN
AFP: 37 ng/mL
Age Alone: 1:923 {titer}
Curr Gest Age: 19.2 wks.days
Down Syndrome Scr Risk Est: 1:21700 {titer}
HCG, Total: 12.68 IU/mL
INH: 77.8 pg/mL
Interpretation-AFP: NEGATIVE
MoM for AFP: 0.93
MoM for INH: 0.59
MoM for hCG: 0.74
Open Spina bifida: NEGATIVE
Osb Risk: 1:34500 {titer}
Tri 18 Scr Risk Est: NEGATIVE
Trisomy 18 (Edward) Syndrome Interp.: 1:29800 {titer}
uE3 Mom: 0.9
uE3 Value: 1.28 ng/mL

## 2014-10-07 ENCOUNTER — Ambulatory Visit (HOSPITAL_COMMUNITY)
Admission: RE | Admit: 2014-10-07 | Discharge: 2014-10-07 | Disposition: A | Discharge status: Home / Self Care | Payer: Medicaid Other | Source: Ambulatory Visit | Attending: Family | Admitting: Family

## 2014-10-07 ENCOUNTER — Ambulatory Visit (INDEPENDENT_AMBULATORY_CARE_PROVIDER_SITE_OTHER): Payer: Self-pay | Admitting: Family Medicine

## 2014-10-07 VITALS — BP 106/63 | HR 96 | Temp 98.2°F | Wt 265.8 lb

## 2014-10-07 DIAGNOSIS — O418X10 Other specified disorders of amniotic fluid and membranes, first trimester, not applicable or unspecified: Secondary | ICD-10-CM

## 2014-10-07 DIAGNOSIS — Z3A23 23 weeks gestation of pregnancy: Secondary | ICD-10-CM | POA: Insufficient documentation

## 2014-10-07 DIAGNOSIS — O468X1 Other antepartum hemorrhage, first trimester: Secondary | ICD-10-CM | POA: Insufficient documentation

## 2014-10-07 DIAGNOSIS — Z0489 Encounter for examination and observation for other specified reasons: Secondary | ICD-10-CM | POA: Insufficient documentation

## 2014-10-07 DIAGNOSIS — IMO0002 Reserved for concepts with insufficient information to code with codable children: Secondary | ICD-10-CM | POA: Insufficient documentation

## 2014-10-07 LAB — POCT URINALYSIS DIP (DEVICE)
BILIRUBIN URINE: NEGATIVE
Glucose, UA: 250 mg/dL — AB
HGB URINE DIPSTICK: NEGATIVE
LEUKOCYTES UA: NEGATIVE
Nitrite: NEGATIVE
PROTEIN: NEGATIVE mg/dL
Specific Gravity, Urine: >=1.03 (ref 1.005–1.030)
UROBILINOGEN UA: 1 mg/dL (ref 0.0–1.0)
pH: 6.5 (ref 5.0–8.0)

## 2014-10-07 MED ORDER — MICONAZOLE NITRATE 2 % EX OINT: 1.0000 g | TOPICAL_OINTMENT | Freq: Two times a day (BID) | CUTANEOUS | Status: DC

## 2014-10-07 NOTE — Progress Notes (Signed)
Patient is 29 y.o. G3P2002 5837w2d. +FM, no vaginal discharge/LOF #rash: benadryl not helping.  Has had with previous pregnancies, never extending to neck as is currently.  Appears patchy, satellite lesions, plaque like along breast folds extending up to breast => discussed with Dr. Debroah LoopArnold, suspect fungal inf although may be atypical PUPP.  rx miconazole ointment BID, if no improvement at next visit consider rx steroid cream #abd pain: no improvement, still pain with walking  Has repeat anatomy sono today, will f/u growth given increased fundal height, although may be discrepancy 2/2 morbid obesity, difficult to palpate fundus

## 2014-10-25 ENCOUNTER — Encounter (HOSPITAL_COMMUNITY): Payer: Self-pay | Admitting: *Deleted

## 2014-10-25 ENCOUNTER — Encounter: Payer: Self-pay | Admitting: Obstetrics and Gynecology

## 2014-10-25 ENCOUNTER — Inpatient Hospital Stay (HOSPITAL_COMMUNITY)
Admission: AD | Admit: 2014-10-25 | Discharge: 2014-10-25 | Disposition: A | Discharge status: Home / Self Care | Payer: Medicaid Other | Source: Ambulatory Visit | Attending: Obstetrics & Gynecology | Admitting: Obstetrics & Gynecology

## 2014-10-25 ENCOUNTER — Inpatient Hospital Stay (HOSPITAL_COMMUNITY): Payer: Medicaid Other

## 2014-10-25 DIAGNOSIS — X58XXXA Exposure to other specified factors, initial encounter: Secondary | ICD-10-CM | POA: Diagnosis not present

## 2014-10-25 DIAGNOSIS — S3981XA Other specified injuries of abdomen, initial encounter: Secondary | ICD-10-CM | POA: Diagnosis not present

## 2014-10-25 DIAGNOSIS — Z87891 Personal history of nicotine dependence: Secondary | ICD-10-CM | POA: Diagnosis not present

## 2014-10-25 DIAGNOSIS — Z3A25 25 weeks gestation of pregnancy: Secondary | ICD-10-CM | POA: Insufficient documentation

## 2014-10-25 DIAGNOSIS — O9A219 Injury, poisoning and certain other consequences of external causes complicating pregnancy, unspecified trimester: Secondary | ICD-10-CM | POA: Insufficient documentation

## 2014-10-25 DIAGNOSIS — N949 Unspecified condition associated with female genital organs and menstrual cycle: Secondary | ICD-10-CM

## 2014-10-25 DIAGNOSIS — W228XXA Striking against or struck by other objects, initial encounter: Secondary | ICD-10-CM

## 2014-10-25 DIAGNOSIS — O9A212 Injury, poisoning and certain other consequences of external causes complicating pregnancy, second trimester: Secondary | ICD-10-CM

## 2014-10-25 DIAGNOSIS — S3991XA Unspecified injury of abdomen, initial encounter: Secondary | ICD-10-CM

## 2014-10-25 DIAGNOSIS — R109 Unspecified abdominal pain: Secondary | ICD-10-CM | POA: Insufficient documentation

## 2014-10-25 DIAGNOSIS — O99212 Obesity complicating pregnancy, second trimester: Secondary | ICD-10-CM | POA: Insufficient documentation

## 2014-10-25 DIAGNOSIS — O9989 Other specified diseases and conditions complicating pregnancy, childbirth and the puerperium: Secondary | ICD-10-CM | POA: Diagnosis not present

## 2014-10-25 LAB — URINALYSIS, ROUTINE W REFLEX MICROSCOPIC
Bilirubin Urine: NEGATIVE
GLUCOSE, UA: NEGATIVE mg/dL
HGB URINE DIPSTICK: NEGATIVE
KETONES UR: NEGATIVE mg/dL
NITRITE: NEGATIVE
PH: 7.5 (ref 5.0–8.0)
Protein, ur: NEGATIVE mg/dL
SPECIFIC GRAVITY, URINE: 1.025 (ref 1.005–1.030)
UROBILINOGEN UA: 0.2 mg/dL (ref 0.0–1.0)

## 2014-10-25 LAB — URINE MICROSCOPIC-ADD ON

## 2014-10-25 MED ORDER — TRAMADOL HCL 50 MG PO TABS
50.0000 mg | ORAL_TABLET | Freq: Once | ORAL | Status: AC
  Administered 2014-10-25: 50 mg via ORAL
  Filled 2014-10-25: qty 1

## 2014-10-25 NOTE — MAU Note (Signed)
Pt was working last night and hit her abdomen on a grocery cart. Pt woke up feeling sore and crampy.  Pt denies leaking or bleeding. + FM

## 2014-10-25 NOTE — Discharge Instructions (Signed)
[facility] were [facility] at [facility] after an injury to your abdomen. You were not found to be in early labor. You should follow up with your OB provider within one week for further evaluation. If you developed decreased fetal movement, contractions, vaginal bleeding, or other concerning symptoms you should be evaluated sooner.   Blunt Abdominal Trauma A blunt injury to the abdomen can cause pain. The pain is most likely from bruising and stretching of your muscles. This pain is often made worse with movement. Most often these injuries are not serious and get better within 1 week with rest and mild pain medicine. However, internal organs (liver, spleen, kidneys) can be injured with blunt trauma. If you do not get better or if you get worse, further examination may be needed. Continue with your regular daily activities, but avoid any strenuous activities until your pain is improved. If your stomach is upset, stick to a clear liquid diet and slowly advance to solid food.  SEEK IMMEDIATE MEDICAL CARE IF:    You develop increasing pain, nausea, or repeated vomiting.  You develop chest pain or breathing difficulty.  You develop blood in the urine, vomit, or stool.  You develop weakness, fainting, fever, or other serious complaints. Document Released: 09/27/2004 Document Revised: 11/12/2011 Document Reviewed: 01/13/2009 [facility] Patient Information 2015 [place]. This information is not intended to replace advice given to you by your health care provider. Make sure you discuss any questions you have with your health care provider.

## 2014-10-25 NOTE — MAU Provider Note (Signed)
History     CSN: [id number]  Arrival date and time: 10/25/14 1252   First Provider Initiated Contact with Patient 10/25/14 1345      Chief Complaint  Patient presents with  . Abdominal Pain   HPI  Sherry Novembericole A Ferguson is a [id number] at 6015w6d who presents to MAU for evaluation after blunt abdominal trauma last night. 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 States a long line of carts hit her abdomen "hard" and has caused her to experience cramping and pain since that time. She tried to go to work today but was unable to stand secondary to the pain, so came to the MAU for further evaluation. +Fetal movement. Denies loss of fluid, vaginal bleeding, or contractions.   OB History    Gravida Para Term Preterm AB TAB SAB Ectopic Multiple Living   3 2 2       2       Past Medical History  Diagnosis Date  . Allergy   . Asthma   . IUD (intrauterine device) in place     no longer has IUD- removed in 2015  . Bronchitis, acute     Past Surgical History  Procedure Laterality Date  . No past surgeries      Family History  Problem Relation Age of Onset  . Asthma Mother   . Diabetes Father   . Hyperlipidemia Father   . Hypertension Father   . Bronchitis Father     History  Substance Use Topics  . Smoking status: Former Smoker    Types: Cigarettes    Quit date: 09/04/2007  . Smokeless tobacco: Never Used  . Alcohol Use: No    Allergies: No Known Allergies  Prescriptions prior to admission  Medication Sig Dispense Refill Last Dose  . acetaminophen (TYLENOL) 325 MG tablet Take 650 mg by mouth every 6 (six) hours as needed for mild pain or moderate pain.    Past Month at Unknown time  . budesonide-formoterol (SYMBICORT) 160-4.5 MCG/ACT inhaler Inhale 2 puffs into the lungs 2 (two) times daily. 1 Inhaler 12 10/25/2014 at Unknown time  . hydrOXYzine (VISTARIL) 25 MG capsule Take 1 capsule (25 mg total) by mouth 3 (three) times daily as needed. 30 capsule 0 10/24/2014 at Unknown  time  . ranitidine (ZANTAC) 75 MG tablet Take 75 mg by mouth 2 (two) times daily.   10/24/2014 at Unknown time  . albuterol (PROVENTIL HFA;VENTOLIN HFA) 108 (90 BASE) MCG/ACT inhaler Inhale 2 puffs into the lungs every 6 (six) hours as needed for wheezing or shortness of breath. 1 Inhaler 1 emergency  . albuterol (PROVENTIL) (2.5 MG/3ML) 0.083% nebulizer solution Take 3 mLs (2.5 mg total) by nebulization every 6 (six) hours as needed for wheezing or shortness of breath. 75 mL 1 emergency  . Miconazole Nitrate 2 % OINT Apply 1 g topically 2 (two) times daily. (Patient not taking: Reported on 10/25/2014) 30 g 0   . prenatal vitamin w/FE, FA (PRENATAL 1 + 1) 27-1 MG TABS tablet Take 1 tablet by mouth daily at 12 noon.   10/13/2014    Review of Systems  Constitutional: Negative.  Negative for fever, chills and malaise/fatigue.  HENT: Negative.  Negative for congestion and sore throat.   Eyes: Negative.  Negative for double vision and photophobia.  Respiratory: Negative.  Negative for cough, shortness of breath and wheezing.   Cardiovascular: Negative.  Negative for chest pain and leg swelling.  Gastrointestinal: Positive for abdominal pain. Negative for  nausea, vomiting, diarrhea and constipation.  Genitourinary: Negative.  Negative for dysuria, urgency, frequency and hematuria.  Musculoskeletal: Negative.  Negative for myalgias.  Skin: Negative.   Neurological: Negative.  Negative for weakness and headaches.  Psychiatric/Behavioral: Negative.   All other systems reviewed and are negative.  Physical Exam   Blood pressure 99/59, pulse 85, temperature 98.5 F (36.9 C), temperature source Oral, resp. rate 18, height  (1.651 m), weight 272 lb (123.378 kg), last menstrual period 04/27/2014.  Physical Exam  Nursing note and vitals reviewed. Constitutional: She is oriented to person, place, and time. She appears well-developed and well-nourished. No distress.  HENT:  Head: Normocephalic and  atraumatic.  Cardiovascular: Normal rate.   Respiratory: Effort normal.  GI: Soft. Bowel sounds are normal. She exhibits no distension. There is no tenderness. There is no rebound and no guarding.  Gravid  Neurological: She is alert and oriented to person, place, and time.  Skin: Skin is warm and dry.  Psychiatric: She has a normal mood and affect. Her behavior is normal.    MAU Course  Procedures  MDM NST reactive and reassuring for gestational age Toco: monitored for 4 hours without contractions  Ultrasound: Viable intrauterine pregnancy at [redacted] weeks gestation, breech, normal placenta, normal fluid levels  Assessment and Plan  Sherry Ferguson is a 29 y.o. G3P2002 at [redacted]w[redacted]d who presents to MAU after blunt abdominal trauma while at work last night. Less than 24 hours from injury. Monitored in MAU for four hours without contractions.   #Abdominal trauma, blunt - Monitored for four hours without evidence of contractions. No evidence of preterm labor.  - Abdominal pain likely secondary to round ligament pain and unrelated to trauma. Recommend abdominal binder.  - Reviewed vaginal bleeding, fetal movement, and preterm labor precautions.   Follow up in clinic within 1 week for reevaluation.  William Dalton 10/25/2014, 4:06 PM

## 2014-11-05 ENCOUNTER — Encounter: Payer: Self-pay | Admitting: Physician Assistant

## 2014-11-05 ENCOUNTER — Ambulatory Visit (INDEPENDENT_AMBULATORY_CARE_PROVIDER_SITE_OTHER): Payer: Medicaid Other | Admitting: Physician Assistant

## 2014-11-05 VITALS — BP 105/66 | HR 97 | Wt 268.8 lb

## 2014-11-05 DIAGNOSIS — Z23 Encounter for immunization: Secondary | ICD-10-CM

## 2014-11-05 DIAGNOSIS — Z3493 Encounter for supervision of normal pregnancy, unspecified, third trimester: Secondary | ICD-10-CM

## 2014-11-05 DIAGNOSIS — O418X10 Other specified disorders of amniotic fluid and membranes, first trimester, not applicable or unspecified: Secondary | ICD-10-CM

## 2014-11-05 DIAGNOSIS — O468X1 Other antepartum hemorrhage, first trimester: Secondary | ICD-10-CM

## 2014-11-05 LAB — CBC
HCT: 37 % (ref 36.0–46.0)
Hemoglobin: 12.2 g/dL (ref 12.0–15.0)
MCH: 28.6 pg (ref 26.0–34.0)
MCHC: 33 g/dL (ref 30.0–36.0)
MCV: 86.7 fL (ref 78.0–100.0)
MPV: 10.8 fL (ref 8.6–12.4)
PLATELETS: 256 10*3/uL (ref 150–400)
RBC: 4.27 MIL/uL (ref 3.87–5.11)
RDW: 13.9 % (ref 11.5–15.5)
WBC: 8.4 10*3/uL (ref 4.0–10.5)

## 2014-11-05 LAB — POCT URINALYSIS DIP (DEVICE)
BILIRUBIN URINE: NEGATIVE
Glucose, UA: 100 mg/dL — AB
Hgb urine dipstick: NEGATIVE
Ketones, ur: NEGATIVE mg/dL
NITRITE: NEGATIVE
Protein, ur: NEGATIVE mg/dL
SPECIFIC GRAVITY, URINE: 1.02 (ref 1.005–1.030)
UROBILINOGEN UA: 1 mg/dL (ref 0.0–1.0)
pH: 7 (ref 5.0–8.0)

## 2014-11-05 MED ORDER — HYDROXYZINE PAMOATE 25 MG PO CAPS: 25.0000 mg | ORAL_CAPSULE | Freq: Three times a day (TID) | ORAL | Status: DC | PRN

## 2014-11-05 MED ORDER — TETANUS-DIPHTH-ACELL PERTUSSIS 5-2.5-18.5 LF-MCG/0.5 IM SUSP
0.5000 mL | Freq: Once | INTRAMUSCULAR | Status: AC
  Administered 2014-11-05: 0.5 mL via INTRAMUSCULAR

## 2014-11-05 NOTE — Patient Instructions (Signed)

## 2014-11-05 NOTE — Progress Notes (Signed)
27 weeks, stable.  Requests refill of vistaril for rash and help with sleeping.  Endorses good fetal movement.  Denies LOF, vag bleeding, dysuria.   Vistaril refilled.   PTL precautions reviewed Monitor fundal height.   TDap, 28 week labs today. RTC 2 weeks

## 2014-11-06 LAB — HIV ANTIBODY (ROUTINE TESTING W REFLEX): HIV 1&2 Ab, 4th Generation: NONREACTIVE

## 2014-11-06 LAB — GLUCOSE TOLERANCE, 1 HOUR (50G) W/O FASTING: GLUCOSE 1 HOUR GTT: 115 mg/dL (ref 70–140)

## 2014-11-06 LAB — RPR

## 2014-12-03 ENCOUNTER — Encounter: Payer: Medicaid Other | Admitting: Physician Assistant

## 2014-12-08 ENCOUNTER — Telehealth: Payer: Self-pay | Admitting: Internal Medicine

## 2014-12-08 NOTE — Telephone Encounter (Signed)
Ok, a few issues here.  I am sympathetic to financial hardship and lack of insurance . However, since her last visit here in 2015 she has been to urgent care and the emergency department, once for sinus infection, once for asthma, both of which we can treat and deal with here.  I am sure she spent way more money in those 2 visits than it would cost for an office visit here, particular when we discount self pay visits heavily.   So I have to see her at least yearly or periodically to evaluate and treat.   Secondly, from what I see in the record, she is pregnant.   Symbicort, a combo inhaler with inhaled steroid is a category C drug in pregnancy.  I would have to defer to her obstetrician to see what they feel is safe for her to use as a preventative asthma medication.   They may only want her using albuterol, or long acting Flovent or similar.   She needs to check with her OB doctor.

## 2014-12-08 NOTE — Telephone Encounter (Signed)
Pt states that she needs a refill on her symbicort. i told pt she would need to come in for an appt but her insurance only covers pregnancy. So can you send in med to [facility] [place]

## 2014-12-09 NOTE — Telephone Encounter (Signed)
Left message for patient to call her OB for inhaler prescription, or call back here is she desires an appointment to discuss symptoms

## 2014-12-10 ENCOUNTER — Ambulatory Visit (INDEPENDENT_AMBULATORY_CARE_PROVIDER_SITE_OTHER): Payer: Medicaid Other | Admitting: Family Medicine

## 2014-12-10 VITALS — BP 105/63 | HR 97 | Temp 97.7°F | Wt 272.4 lb

## 2014-12-10 DIAGNOSIS — O418X10 Other specified disorders of amniotic fluid and membranes, first trimester, not applicable or unspecified: Secondary | ICD-10-CM

## 2014-12-10 DIAGNOSIS — O468X1 Other antepartum hemorrhage, first trimester: Secondary | ICD-10-CM

## 2014-12-10 LAB — POCT URINALYSIS DIP (DEVICE)
BILIRUBIN URINE: NEGATIVE
Glucose, UA: 100 mg/dL — AB
Hgb urine dipstick: NEGATIVE
KETONES UR: NEGATIVE mg/dL
Leukocytes, UA: NEGATIVE
Nitrite: NEGATIVE
PROTEIN: NEGATIVE mg/dL
Specific Gravity, Urine: 1.025 (ref 1.005–1.030)
Urobilinogen, UA: 2 mg/dL — ABNORMAL HIGH (ref 0.0–1.0)
pH: 7 (ref 5.0–8.0)

## 2014-12-10 NOTE — Progress Notes (Signed)
C/o still having nipple pain. States under a lot of stress now and wants to talk with doctor.

## 2014-12-10 NOTE — Progress Notes (Signed)
Taking Zantac for heartburn Having allergies - recommended Zyrtec Still having difficulty with sleep - Vistaril or Unisom. Discussed Stress - [facility] F/u 2 weeks with OB provider FM/labor precautions

## 2014-12-10 NOTE — Patient Instructions (Signed)

## 2014-12-13 ENCOUNTER — Ambulatory Visit: Payer: Medicaid Other

## 2014-12-16 ENCOUNTER — Ambulatory Visit: Payer: Medicaid Other

## 2014-12-17 ENCOUNTER — Encounter: Payer: Medicaid Other | Admitting: Certified Nurse Midwife

## 2014-12-20 ENCOUNTER — Ambulatory Visit: Payer: Medicaid Other

## 2014-12-24 ENCOUNTER — Encounter: Payer: Self-pay | Admitting: Obstetrics & Gynecology

## 2014-12-24 ENCOUNTER — Ambulatory Visit (INDEPENDENT_AMBULATORY_CARE_PROVIDER_SITE_OTHER): Payer: Medicaid Other | Admitting: Obstetrics & Gynecology

## 2014-12-24 VITALS — BP 105/61 | HR 108 | Temp 98.4°F | Wt 274.6 lb

## 2014-12-24 DIAGNOSIS — Z3491 Encounter for supervision of normal pregnancy, unspecified, first trimester: Secondary | ICD-10-CM

## 2014-12-24 DIAGNOSIS — O418X10 Other specified disorders of amniotic fluid and membranes, first trimester, not applicable or unspecified: Secondary | ICD-10-CM

## 2014-12-24 DIAGNOSIS — O468X1 Other antepartum hemorrhage, first trimester: Secondary | ICD-10-CM

## 2014-12-24 LAB — POCT URINALYSIS DIP (DEVICE)
BILIRUBIN URINE: NEGATIVE
Glucose, UA: 100 mg/dL — AB
HGB URINE DIPSTICK: NEGATIVE
Ketones, ur: NEGATIVE mg/dL
Nitrite: NEGATIVE
PH: 7 (ref 5.0–8.0)
PROTEIN: NEGATIVE mg/dL
Specific Gravity, Urine: 1.02 (ref 1.005–1.030)
Urobilinogen, UA: 0.2 mg/dL (ref 0.0–1.0)

## 2014-12-24 MED ORDER — BUDESONIDE-FORMOTEROL FUMARATE 160-4.5 MCG/ACT IN AERO: 2.0000 | INHALATION_SPRAY | Freq: Two times a day (BID) | RESPIRATORY_TRACT | Status: DC

## 2014-12-24 NOTE — Progress Notes (Signed)
Routine visit. Good FM. Normal aches and pains of pregnancy. Labor precautions reviewed. Cultures at next visit.

## 2014-12-24 NOTE — Addendum Note (Signed)
Addended by: Allie BossierVE, Lilian Fuhs C on: 12/24/2014 09:04 AM   Modules accepted: Orders

## 2014-12-24 NOTE — Progress Notes (Signed)
Leukocytes: Trace 

## 2014-12-30 ENCOUNTER — Encounter (HOSPITAL_COMMUNITY): Payer: Self-pay | Admitting: *Deleted

## 2014-12-30 ENCOUNTER — Inpatient Hospital Stay (HOSPITAL_COMMUNITY)
Admission: AD | Admit: 2014-12-30 | Discharge: 2014-12-30 | Disposition: A | Discharge status: Home / Self Care | Payer: Medicaid Other | Source: Ambulatory Visit | Attending: Obstetrics & Gynecology | Admitting: Obstetrics & Gynecology

## 2014-12-30 DIAGNOSIS — Z3A35 35 weeks gestation of pregnancy: Secondary | ICD-10-CM | POA: Diagnosis not present

## 2014-12-30 DIAGNOSIS — Z3493 Encounter for supervision of normal pregnancy, unspecified, third trimester: Secondary | ICD-10-CM

## 2014-12-30 DIAGNOSIS — O368131 Decreased fetal movements, third trimester, fetus 1: Secondary | ICD-10-CM | POA: Diagnosis not present

## 2014-12-30 DIAGNOSIS — O368130 Decreased fetal movements, third trimester, not applicable or unspecified: Secondary | ICD-10-CM | POA: Diagnosis present

## 2014-12-30 MED ORDER — HYDROXYZINE PAMOATE 25 MG PO CAPS: 25.0000 mg | ORAL_CAPSULE | Freq: Three times a day (TID) | ORAL | Status: DC | PRN

## 2014-12-30 NOTE — Discharge Instructions (Signed)

## 2014-12-30 NOTE — MAU Note (Signed)
Pt stated she had not felt baby move since 830 last night.Denies pain or discomfort. Reports she also has been vomiting every morning for the last week.

## 2014-12-31 ENCOUNTER — Ambulatory Visit (INDEPENDENT_AMBULATORY_CARE_PROVIDER_SITE_OTHER): Payer: Medicaid Other | Admitting: Advanced Practice Midwife

## 2014-12-31 VITALS — BP 115/67 | HR 99 | Wt 272.8 lb

## 2014-12-31 DIAGNOSIS — Z3483 Encounter for supervision of other normal pregnancy, third trimester: Secondary | ICD-10-CM

## 2014-12-31 DIAGNOSIS — Z3493 Encounter for supervision of normal pregnancy, unspecified, third trimester: Secondary | ICD-10-CM

## 2014-12-31 LAB — POCT URINALYSIS DIP (DEVICE)
BILIRUBIN URINE: NEGATIVE
Glucose, UA: 100 mg/dL — AB
Hgb urine dipstick: NEGATIVE
Ketones, ur: NEGATIVE mg/dL
LEUKOCYTES UA: NEGATIVE
NITRITE: NEGATIVE
Protein, ur: 30 mg/dL — AB
SPECIFIC GRAVITY, URINE: 1.025 (ref 1.005–1.030)
UROBILINOGEN UA: 1 mg/dL (ref 0.0–1.0)
pH: 7 (ref 5.0–8.0)

## 2014-12-31 NOTE — Progress Notes (Signed)
Seen in MAU for decreased FM yesterday. Active baby since.

## 2014-12-31 NOTE — Patient Instructions (Signed)
Zantac 150 mg twice a day  Food Choices for Gastroesophageal Reflux Disease When you have gastroesophageal reflux disease (GERD), the foods you eat and your eating habits are very important. Choosing the right foods can help ease the discomfort of GERD. WHAT GENERAL GUIDELINES DO I NEED TO FOLLOW?  Choose fruits, vegetables, whole grains, low-fat dairy products, and low-fat meat, fish, and poultry.  Limit fats such as oils, salad dressings, butter, nuts, and avocado.  Keep a food diary to identify foods that cause symptoms.  Avoid foods that cause reflux. These may be different for different people.  Eat frequent small meals instead of three large meals each day.  Eat your meals slowly, in a relaxed setting.  Limit fried foods.  Cook foods using methods other than frying.  Avoid drinking alcohol.  Avoid drinking large amounts of liquids with your meals.  Avoid bending over or lying down until 2-3 hours after eating. WHAT FOODS ARE NOT RECOMMENDED? The following are some foods and drinks that may worsen your symptoms: Vegetables Tomatoes. Tomato juice. Tomato and spaghetti sauce. Chili peppers. Onion and garlic. Horseradish. Fruits Oranges, grapefruit, and lemon (fruit and juice). Meats High-fat meats, fish, and poultry. This includes hot dogs, ribs, ham, sausage, salami, and bacon. Dairy Whole milk and chocolate milk. Sour cream. Cream. Butter. Ice cream. Cream cheese.  Beverages Coffee and tea, with or without caffeine. Carbonated beverages or energy drinks. Condiments Hot sauce. Barbecue sauce.  Sweets/Desserts Chocolate and cocoa. Donuts. Peppermint and spearmint. Fats and Oils High-fat foods, including [place] fries and potato chips. Other Vinegar. Strong spices, such as black pepper, white pepper, red pepper, cayenne, curry powder, cloves, ginger, and chili powder. The items listed above may not be a complete list of foods and beverages to avoid. Contact your  dietitian for more information. Document Released: 08/20/2005 Document Revised: 08/25/2013 Document Reviewed: 06/24/2013 [facility] Patient Information 2015 [place], [place]. This information is not intended to replace advice given to you by your health care provider. Make sure you discuss any questions you have with your health care provider.  Heartburn During Pregnancy  Heartburn is a burning sensation in the chest caused by stomach acid backing up into the esophagus. Heartburn is common in pregnancy because a certain hormone (progesterone) is released when a woman is pregnant. The progesterone hormone may relax the valve that separates the esophagus from the stomach. This allows acid to go up into the esophagus, causing heartburn. Heartburn may also happen in pregnancy because the enlarging uterus pushes up on the stomach, which pushes more acid into the esophagus. This is especially true in the later stages of pregnancy. Heartburn problems usually go away after giving birth. CAUSES  Heartburn is caused by stomach acid backing up into the esophagus. During pregnancy, this may result from various things, including:   The progesterone hormone.  Changing hormone levels.  The growing uterus pushing stomach acid upward.  Large meals.  Certain foods and drinks.  Exercise.  Increased acid production. SIGNS AND SYMPTOMS   Burning pain in the chest or lower throat.  Bitter taste in the mouth.  Coughing. DIAGNOSIS  Your health care provider will typically diagnose heartburn by taking a careful history of your concern. Blood tests may be done to check for a certain type of bacteria that is associated with heartburn. Sometimes, heartburn is diagnosed by prescribing a heartburn medicine to see if the symptoms improve. In some cases, a procedure called an endoscopy may be done. In this procedure, a tube  with a light and a camera on the end (endoscope) is used to examine the esophagus and the  stomach. TREATMENT  Treatment will vary depending on the severity of your symptoms. Your health care provider may recommend:  Over-the-counter medicines (antacids, acid reducers) for mild heartburn.  Prescription medicines to decrease stomach acid or to protect your stomach lining.  Certain changes in your diet.  Elevating the head of your bed by putting blocks under the legs. This helps prevent stomach acid from backing up into the esophagus when you are lying down. HOME CARE INSTRUCTIONS   Only take over-the-counter or prescription medicines as directed by your health care provider.  Raise the head of your bed by putting blocks under the legs if instructed to do so by your health care provider. Sleeping with more pillows is not effective because it only changes the position of your head.  Do not exercise right after eating.  Avoid eating 2-3 hours before bed. Do not lie down right after eating.  Eat small meals throughout the day instead of three large meals.  Identify foods and beverages that make your symptoms worse and avoid them. Foods you may want to avoid include:  Peppers.  Chocolate.  High-fat foods, including fried foods.  Spicy foods.  Garlic and onions.  Citrus fruits, including oranges, grapefruit, lemons, and limes.  Food containing tomatoes or tomato products.  Mint.  Carbonated and caffeinated drinks.  Vinegar. SEEK MEDICAL CARE IF:  You have abdominal pain of any kind.  You feel burning in your upper abdomen or chest, especially after eating or lying down.  You have nausea and vomiting.  Your stomach feels upset after you eat. SEEK IMMEDIATE MEDICAL CARE IF:   You have severe chest pain that goes down your arm or into your jaw or neck.  You feel sweaty, dizzy, or light-headed.  You become short of breath.  You vomit blood.  You have difficulty or pain with swallowing.  You have bloody or black, tarry stools.  You have episodes of  heartburn more than 3 times a week, for more than 2 weeks. MAKE SURE YOU:  Understand these instructions.  Will watch your condition.  Will get help right away if you are not doing well or get worse. Document Released: 08/17/2000 Document Revised: 08/25/2013 Document Reviewed: 04/08/2013 [facility] Patient Information 2015 [place]. This information is not intended to replace advice given to you by your health care provider. Make sure you discuss any questions you have with your health care provider.

## 2015-01-07 ENCOUNTER — Encounter: Payer: Medicaid Other | Admitting: Family Medicine

## 2015-01-10 ENCOUNTER — Ambulatory Visit (INDEPENDENT_AMBULATORY_CARE_PROVIDER_SITE_OTHER): Payer: Medicaid Other | Admitting: Family Medicine

## 2015-01-10 ENCOUNTER — Other Ambulatory Visit: Payer: Self-pay | Admitting: Family Medicine

## 2015-01-10 VITALS — BP 102/66 | HR 99 | Temp 98.0°F | Wt 275.8 lb

## 2015-01-10 DIAGNOSIS — O468X1 Other antepartum hemorrhage, first trimester: Secondary | ICD-10-CM

## 2015-01-10 DIAGNOSIS — O418X10 Other specified disorders of amniotic fluid and membranes, first trimester, not applicable or unspecified: Secondary | ICD-10-CM

## 2015-01-10 LAB — POCT URINALYSIS DIP (DEVICE)
Bilirubin Urine: NEGATIVE
Glucose, UA: NEGATIVE mg/dL
Hgb urine dipstick: NEGATIVE
Ketones, ur: NEGATIVE mg/dL
Leukocytes, UA: NEGATIVE
Nitrite: NEGATIVE
Protein, ur: 30 mg/dL — AB
SPECIFIC GRAVITY, URINE: 1.025 (ref 1.005–1.030)
Urobilinogen, UA: 2 mg/dL — ABNORMAL HIGH (ref 0.0–1.0)
pH: 7 (ref 5.0–8.0)

## 2015-01-10 LAB — OB RESULTS CONSOLE GC/CHLAMYDIA
Chlamydia: NEGATIVE
Gonorrhea: NEGATIVE

## 2015-01-10 LAB — OB RESULTS CONSOLE GBS: GBS: NEGATIVE

## 2015-01-10 NOTE — Progress Notes (Signed)
Patient is 29 y.o. [id number].  +FM, denies LOF, VB, contractions, vaginal discharge.  Feeling overall uncomfortable, lots of pressure - GBS and repeat G/C today - fetal kick counts reviewed

## 2015-01-12 LAB — GC/CHLAMYDIA PROBE AMP
CT Probe RNA: NEGATIVE
GC Probe RNA: NEGATIVE

## 2015-01-12 LAB — CULTURE, BETA STREP (GROUP B ONLY)

## 2015-01-14 ENCOUNTER — Ambulatory Visit (INDEPENDENT_AMBULATORY_CARE_PROVIDER_SITE_OTHER): Payer: Medicaid Other | Admitting: Family Medicine

## 2015-01-14 VITALS — BP 113/73 | HR 105 | Temp 98.3°F | Wt 273.1 lb

## 2015-01-14 DIAGNOSIS — O418X10 Other specified disorders of amniotic fluid and membranes, first trimester, not applicable or unspecified: Secondary | ICD-10-CM

## 2015-01-14 DIAGNOSIS — O468X1 Other antepartum hemorrhage, first trimester: Secondary | ICD-10-CM

## 2015-01-14 LAB — POCT URINALYSIS DIP (DEVICE)
Bilirubin Urine: NEGATIVE
Glucose, UA: NEGATIVE mg/dL
Hgb urine dipstick: NEGATIVE
Nitrite: NEGATIVE
Protein, ur: NEGATIVE mg/dL
SPECIFIC GRAVITY, URINE: 1.025 (ref 1.005–1.030)
UROBILINOGEN UA: 0.2 mg/dL (ref 0.0–1.0)
pH: 7 (ref 5.0–8.0)

## 2015-01-14 NOTE — Progress Notes (Signed)
Patient reports pelvic pain/pressure  

## 2015-01-14 NOTE — Progress Notes (Signed)
Patient is 29 y.o. [id number] 352w3d.  +FM, denies LOF, VB, vaginal discharge.  Overall feeling well.  Has rare contractions - husband going to get vasectomy, has discussed with his PCP

## 2015-01-14 NOTE — Patient Instructions (Signed)

## 2015-01-21 ENCOUNTER — Encounter: Payer: Self-pay | Admitting: Obstetrics and Gynecology

## 2015-01-21 ENCOUNTER — Ambulatory Visit (INDEPENDENT_AMBULATORY_CARE_PROVIDER_SITE_OTHER): Payer: Medicaid Other | Admitting: Obstetrics and Gynecology

## 2015-01-21 VITALS — BP 112/73 | HR 80 | Temp 98.2°F | Wt 282.0 lb

## 2015-01-21 DIAGNOSIS — Z3493 Encounter for supervision of normal pregnancy, unspecified, third trimester: Secondary | ICD-10-CM

## 2015-01-21 LAB — POCT URINALYSIS DIP (DEVICE)
Bilirubin Urine: NEGATIVE
Glucose, UA: 250 mg/dL — AB
HGB URINE DIPSTICK: NEGATIVE
Ketones, ur: NEGATIVE mg/dL
LEUKOCYTES UA: NEGATIVE
Nitrite: NEGATIVE
PROTEIN: NEGATIVE mg/dL
Specific Gravity, Urine: 1.02 (ref 1.005–1.030)
Urobilinogen, UA: 0.2 mg/dL (ref 0.0–1.0)
pH: 7 (ref 5.0–8.0)

## 2015-01-21 NOTE — Progress Notes (Signed)
Doing well except continues to have trouble sleeping and uses Benadryl/Vistaril. Cautioned to minimize use; try relaxation measures. Good FM. No LOF, VB. Labor precautions and signs/sx reviewed (had IOL at 39 wks x 2).

## 2015-01-21 NOTE — Patient Instructions (Signed)
Third Trimester of Pregnancy The third trimester is from week 29 through week 42, months 7 through 9. The third trimester is a time when the fetus is growing rapidly. At the end of the ninth month, the fetus is about 20 inches in length and weighs 6-10 pounds.  BODY CHANGES Your body goes through many changes during pregnancy. The changes vary from woman to woman.   Your weight will continue to increase. You can expect to gain 25-35 pounds (11-16 kg) by the end of the pregnancy.  You may begin to get stretch marks on your hips, abdomen, and breasts.  You may urinate more often because the fetus is moving lower into your pelvis and pressing on your bladder.  You may develop or continue to have heartburn as a result of your pregnancy.  You may develop constipation because certain hormones are causing the muscles that push waste through your intestines to slow down.  You may develop hemorrhoids or swollen, bulging veins (varicose veins).  You may have pelvic pain because of the weight gain and pregnancy hormones relaxing your joints between the bones in your pelvis. Backaches may result from overexertion of the muscles supporting your posture.  You may have changes in your hair. These can include thickening of your hair, rapid growth, and changes in texture. Some women also have hair loss during or after pregnancy, or hair that feels dry or thin. Your hair will most likely return to normal after your baby is born.  Your breasts will continue to grow and be tender. A yellow discharge may leak from your breasts called colostrum.  Your belly button may stick out.  You may feel short of breath because of your expanding uterus.  You may notice the fetus "dropping," or moving lower in your abdomen.  You may have a bloody mucus discharge. This usually occurs a few days to a week before labor begins.  Your cervix becomes thin and soft (effaced) near your due date. WHAT TO EXPECT AT YOUR PRENATAL  EXAMS  You will have prenatal exams every 2 weeks until week 36. Then, you will have weekly prenatal exams. During a routine prenatal visit:  You will be weighed to make sure you and the fetus are growing normally.  Your blood pressure is taken.  Your abdomen will be measured to track your baby's growth.  The fetal heartbeat will be listened to.  Any test results from the previous visit will be discussed.  You may have a cervical check near your due date to see if you have effaced. At around 36 weeks, your caregiver will check your cervix. At the same time, your caregiver will also perform a test on the secretions of the vaginal tissue. This test is to determine if a type of bacteria, Group B streptococcus, is present. Your caregiver will explain this further. Your caregiver may ask you:  What your birth plan is.  How you are feeling.  If you are feeling the baby move.  If you have had any abnormal symptoms, such as leaking fluid, bleeding, severe headaches, or abdominal cramping.  If you have any questions. Other tests or screenings that may be performed during your third trimester include:  Blood tests that check for low iron levels (anemia).  Fetal testing to check the health, activity level, and growth of the fetus. Testing is done if you have certain medical conditions or if there are problems during the pregnancy. FALSE LABOR You may feel small, irregular contractions that   eventually go away. These are called Braxton Hicks contractions, or false labor. Contractions may last for hours, days, or even weeks before true labor sets in. If contractions come at regular intervals, intensify, or become painful, it is best to be seen by your caregiver.  SIGNS OF LABOR   Menstrual-like cramps.  Contractions that are 5 minutes apart or less.  Contractions that start on the top of the uterus and spread down to the lower abdomen and back.  A sense of increased pelvic pressure or back  pain.  A watery or bloody mucus discharge that comes from the vagina. If you have any of these signs before the 37th week of pregnancy, call your caregiver right away. You need to go to the hospital to get checked immediately. HOME CARE INSTRUCTIONS   Avoid all smoking, herbs, alcohol, and unprescribed drugs. These chemicals affect the formation and growth of the baby.  Follow your caregiver's instructions regarding medicine use. There are medicines that are either safe or unsafe to take during pregnancy.  Exercise only as directed by your caregiver. Experiencing uterine cramps is a good sign to stop exercising.  Continue to eat regular, healthy meals.  Wear a good support bra for breast tenderness.  Do not use hot tubs, steam rooms, or saunas.  Wear your seat belt at all times when driving.  Avoid raw meat, uncooked cheese, cat litter boxes, and soil used by cats. These carry germs that can cause birth defects in the baby.  Take your prenatal vitamins.  Try taking a stool softener (if your caregiver approves) if you develop constipation. Eat more high-fiber foods, such as fresh vegetables or fruit and whole grains. Drink plenty of fluids to keep your urine clear or pale yellow.  Take warm sitz baths to soothe any pain or discomfort caused by hemorrhoids. Use hemorrhoid cream if your caregiver approves.  If you develop varicose veins, wear support hose. Elevate your feet for 15 minutes, 3-4 times a day. Limit salt in your diet.  Avoid heavy lifting, wear low heal shoes, and practice good posture.  Rest a lot with your legs elevated if you have leg cramps or low back pain.  Visit your dentist if you have not gone during your pregnancy. Use a soft toothbrush to brush your teeth and be gentle when you floss.  A sexual relationship may be continued unless your caregiver directs you otherwise.  Do not travel far distances unless it is absolutely necessary and only with the approval  of your caregiver.  Take prenatal classes to understand, practice, and ask questions about the labor and delivery.  Make a trial run to the hospital.  Pack your hospital bag.  Prepare the baby's nursery.  Continue to go to all your prenatal visits as directed by your caregiver. SEEK MEDICAL CARE IF:  You are unsure if you are in labor or if your water has broken.  You have dizziness.  You have mild pelvic cramps, pelvic pressure, or nagging pain in your abdominal area.  You have persistent nausea, vomiting, or diarrhea.  You have a bad smelling vaginal discharge.  You have pain with urination. SEEK IMMEDIATE MEDICAL CARE IF:   You have a fever.  You are leaking fluid from your vagina.  You have spotting or bleeding from your vagina.  You have severe abdominal cramping or pain.  You have rapid weight loss or gain.  You have shortness of breath with chest pain.  You notice sudden or extreme swelling   of your face, hands, ankles, feet, or legs.  You have not felt your baby move in over an hour.  You have severe headaches that do not go away with medicine.  You have vision changes. Document Released: 08/14/2001 Document Revised: 08/25/2013 Document Reviewed: 10/21/2012 [employer] Patient Information 2015 [employer]. This information is not intended to replace advice given to you by your health care provider. Make sure you discuss any questions you have with your health care provider.  

## 2015-01-26 ENCOUNTER — Ambulatory Visit (INDEPENDENT_AMBULATORY_CARE_PROVIDER_SITE_OTHER): Payer: Medicaid Other | Admitting: Obstetrics and Gynecology

## 2015-01-26 ENCOUNTER — Encounter: Payer: Self-pay | Admitting: Obstetrics and Gynecology

## 2015-01-26 VITALS — BP 101/69 | HR 93 | Temp 98.4°F | Wt 282.0 lb

## 2015-01-26 DIAGNOSIS — Z3483 Encounter for supervision of other normal pregnancy, third trimester: Secondary | ICD-10-CM

## 2015-01-26 DIAGNOSIS — J0191 Acute recurrent sinusitis, unspecified: Secondary | ICD-10-CM

## 2015-01-26 DIAGNOSIS — Z3493 Encounter for supervision of normal pregnancy, unspecified, third trimester: Secondary | ICD-10-CM

## 2015-01-26 DIAGNOSIS — J019 Acute sinusitis, unspecified: Secondary | ICD-10-CM | POA: Insufficient documentation

## 2015-01-26 DIAGNOSIS — O368131 Decreased fetal movements, third trimester, fetus 1: Secondary | ICD-10-CM | POA: Diagnosis not present

## 2015-01-26 LAB — POCT URINALYSIS DIP (DEVICE)
BILIRUBIN URINE: NEGATIVE
Glucose, UA: NEGATIVE mg/dL
HGB URINE DIPSTICK: NEGATIVE
Ketones, ur: NEGATIVE mg/dL
Leukocytes, UA: NEGATIVE
NITRITE: NEGATIVE
PH: 6.5 (ref 5.0–8.0)
PROTEIN: NEGATIVE mg/dL
SPECIFIC GRAVITY, URINE: 1.025 (ref 1.005–1.030)
Urobilinogen, UA: 0.2 mg/dL (ref 0.0–1.0)

## 2015-01-26 MED ORDER — ZOLPIDEM TARTRATE 10 MG PO TABS: 10.0000 mg | ORAL_TABLET | Freq: Every evening | ORAL | Status: DC | PRN

## 2015-01-26 MED ORDER — AZITHROMYCIN 250 MG PO TABS: ORAL_TABLET | ORAL | Status: DC

## 2015-01-26 MED ORDER — AZITHROMYCIN 1 G PO PACK: 1.0000 g | PACK | Freq: Once | ORAL | Status: DC

## 2015-01-26 NOTE — Progress Notes (Signed)
Pt reports decreased FM x1 week.   Pt also thinks she has a sinus infection - nasal congestion (yellow/green), cough.  Pt desires IOL @ 41 wks - scheduled 6/7 @ 0630.

## 2015-01-26 NOTE — Patient Instructions (Signed)

## 2015-01-26 NOTE — Progress Notes (Signed)
Still difficulty sleeping despite Benadryl. Stop Benadryl and Vistaril. Try Ambien prn (#10 rx) C/o1 wk of head pain, green, blood tinged nasal discharge and post nasal drip/cough, similar to sinusitis sx she has had in the past. Rx Z-pack to take if general measures not helping.  No LOF or VB.  Decreased FM> NST done Labor and FM precautions.

## 2015-01-30 ENCOUNTER — Other Ambulatory Visit: Payer: Self-pay | Admitting: Family Medicine

## 2015-01-30 ENCOUNTER — Inpatient Hospital Stay (HOSPITAL_COMMUNITY)
Admission: AD | Admit: 2015-01-30 | Discharge: 2015-01-30 | Disposition: A | Discharge status: Home / Self Care | Payer: Medicaid Other | Source: Ambulatory Visit | Attending: Family Medicine | Admitting: Family Medicine

## 2015-01-30 ENCOUNTER — Encounter (HOSPITAL_COMMUNITY): Payer: Self-pay

## 2015-01-30 ENCOUNTER — Telehealth (HOSPITAL_COMMUNITY): Payer: Self-pay

## 2015-01-30 DIAGNOSIS — Z3A39 39 weeks gestation of pregnancy: Secondary | ICD-10-CM

## 2015-01-30 DIAGNOSIS — R102 Pelvic and perineal pain: Secondary | ICD-10-CM

## 2015-01-30 DIAGNOSIS — S39013A Strain of muscle, fascia and tendon of pelvis, initial encounter: Secondary | ICD-10-CM

## 2015-01-30 DIAGNOSIS — O9989 Other specified diseases and conditions complicating pregnancy, childbirth and the puerperium: Secondary | ICD-10-CM

## 2015-01-30 DIAGNOSIS — S338XXA Sprain of other parts of lumbar spine and pelvis, initial encounter: Secondary | ICD-10-CM

## 2015-01-30 DIAGNOSIS — W1809XA Striking against other object with subsequent fall, initial encounter: Secondary | ICD-10-CM | POA: Insufficient documentation

## 2015-01-30 MED ORDER — CYCLOBENZAPRINE HCL 10 MG PO TABS: 10.0000 mg | ORAL_TABLET | Freq: Once | ORAL | Status: DC

## 2015-01-30 MED ORDER — CYCLOBENZAPRINE HCL 10 MG PO TABS
10.0000 mg | ORAL_TABLET | Freq: Once | ORAL | Status: AC
  Administered 2015-01-30: 10 mg via ORAL
  Filled 2015-01-30: qty 1

## 2015-01-30 MED ORDER — ZOLPIDEM TARTRATE 10 MG PO TABS: 5.0000 mg | ORAL_TABLET | Freq: Every evening | ORAL | Status: DC | PRN

## 2015-01-30 NOTE — MAU Note (Cosign Needed)
Subjective [name] is a 29 y.o. [id number] @ 39wk5d who presents today with left pelvic pain. The pain began yesterday after she kicked a brick at her house in frustration due to her children arguing. She reports feeling minor pain afterwards and went to lay down on the couch. When she attempted to move to her bed a few hours later she found that she was unable to move the leg effectively, which she attributes mainly to pain. She has been able to ambulate with the help of her husband and the use of crutches that they had in the house. The pain is around a 1/10 when she is not moving and a 10/10 when she attempts to move the left leg. The pain is located in her left inguinal region and does not move or radiate. She describes the pain as "like its sore."  Of note, she is currently on d4 of azithromycin for a sinus infection and reports feeling better. She denies fever, N/V, diarrhea, constipation, dysuria, hematuria, and vaginal discharge.  Objective PEx Gen: NAD, alert, and interacts appropriately HEENT: NCAT, MMM, EOMI CV: RRR, no edema Abd: nontender, gravid Skin: no rashes/lesions  Assessment/Plan Pelvic pain is most likely due to MSK sprain/strain. Patient was advised to rest and apply ice to area. Fetal tracing was reassuring of healthy pregnancy.

## 2015-01-30 NOTE — Progress Notes (Signed)
Asked to fill this medication as it could not be sent electronically and she is having difficulty sleeping.

## 2015-01-30 NOTE — MAU Note (Signed)
Pt states was arguing with kids JY:NWGNFAre:moving boxes off the porch and attempted to kick box to move it over and kicked a brick. Felt a "bump" and now is having leg pain and using crutches. No pregnancy issues.

## 2015-01-30 NOTE — Discharge Instructions (Signed)

## 2015-02-02 ENCOUNTER — Inpatient Hospital Stay (HOSPITAL_COMMUNITY)
Admission: AD | Admit: 2015-02-02 | Discharge: 2015-02-02 | Disposition: A | Discharge status: Home / Self Care | Payer: Medicaid Other | Source: Ambulatory Visit | Attending: Family Medicine | Admitting: Family Medicine

## 2015-02-02 ENCOUNTER — Encounter (HOSPITAL_COMMUNITY): Payer: Self-pay | Admitting: *Deleted

## 2015-02-02 DIAGNOSIS — O9989 Other specified diseases and conditions complicating pregnancy, childbirth and the puerperium: Secondary | ICD-10-CM | POA: Diagnosis not present

## 2015-02-02 DIAGNOSIS — K529 Noninfective gastroenteritis and colitis, unspecified: Secondary | ICD-10-CM

## 2015-02-02 DIAGNOSIS — O212 Late vomiting of pregnancy: Secondary | ICD-10-CM | POA: Diagnosis not present

## 2015-02-02 DIAGNOSIS — Z3A40 40 weeks gestation of pregnancy: Secondary | ICD-10-CM | POA: Diagnosis not present

## 2015-02-02 DIAGNOSIS — Z87891 Personal history of nicotine dependence: Secondary | ICD-10-CM | POA: Diagnosis not present

## 2015-02-02 DIAGNOSIS — R102 Pelvic and perineal pain: Secondary | ICD-10-CM | POA: Diagnosis present

## 2015-02-02 DIAGNOSIS — R109 Unspecified abdominal pain: Secondary | ICD-10-CM | POA: Insufficient documentation

## 2015-02-02 DIAGNOSIS — R112 Nausea with vomiting, unspecified: Secondary | ICD-10-CM

## 2015-02-02 LAB — URINALYSIS, ROUTINE W REFLEX MICROSCOPIC
BILIRUBIN URINE: NEGATIVE
GLUCOSE, UA: NEGATIVE mg/dL
Hgb urine dipstick: NEGATIVE
KETONES UR: 15 mg/dL — AB
Leukocytes, UA: NEGATIVE
Nitrite: NEGATIVE
Protein, ur: NEGATIVE mg/dL
SPECIFIC GRAVITY, URINE: 1.02 (ref 1.005–1.030)
UROBILINOGEN UA: 0.2 mg/dL (ref 0.0–1.0)
pH: 6.5 (ref 5.0–8.0)

## 2015-02-02 MED ORDER — OXYCODONE-ACETAMINOPHEN 5-325 MG PO TABS: 1.0000 | ORAL_TABLET | Freq: Four times a day (QID) | ORAL | Status: DC | PRN

## 2015-02-02 MED ORDER — OXYCODONE-ACETAMINOPHEN 5-325 MG PO TABS
2.0000 | ORAL_TABLET | Freq: Once | ORAL | Status: DC
  Filled 2015-02-02: qty 2

## 2015-02-02 NOTE — MAU Provider Note (Signed)
History     CSN: [id number]  Arrival date and time: 02/02/15 1106   First Provider Initiated Contact with Patient 02/02/15 1203      Chief Complaint  Patient presents with  . Pelvic Pain   HPI This is a 29 y.o. female at 6364w2d who presents with c/o recurrance of pelvic pain. She was seen in MAU on Sunday (5/29) for pelvic pain that began after she kicked a brick. She said the pain was limiting her ability to ambulate and she had become reliant on crutches that she happened to have at home to get around. She was diagnosed with a muscle sprain and sent home on Flexeril. Did not like the way it made her feel so stopped taking it.   Patient is also experiencing nausea, vomiting, and diarrhea that began yesterday at 14:30. She had one episode of emesis yesterday. She has not vomited today. Her diarrhea is non bloody and has not worsened since initiation. Has only had about 3 stools. This nausea has prevented her from eating since yesterday. She denies exposure to new foods and sick contacts.  She has been feeling regular fetal movements and denies contractions.  RN Note:  Expand All Collapse All   Pt presents complaining of increased pelvic pain after stumbling in the kitchen. States she is also having nausea and diarrhea. Took a flexiril this am but it didn't help. States she thinks she is dehydrated.           OB History    Gravida Para Term Preterm AB TAB SAB Ectopic Multiple Living   3 2 2       2       Past Medical History  Diagnosis Date  . Allergy   . Asthma   . IUD (intrauterine device) in place     no longer has IUD- removed in 2015  . Bronchitis, acute     Past Surgical History  Procedure Laterality Date  . No past surgeries      Family History  Problem Relation Age of Onset  . Asthma Mother   . Diabetes Father   . Hyperlipidemia Father   . Hypertension Father   . Bronchitis Father     History  Substance Use Topics  . Smoking status: Former Smoker     Types: Cigarettes    Quit date: 09/04/2007  . Smokeless tobacco: Never Used  . Alcohol Use: No    Allergies: No Known Allergies  Prescriptions prior to admission  Medication Sig Dispense Refill Last Dose  . budesonide-formoterol (SYMBICORT) 160-4.5 MCG/ACT inhaler Inhale 2 puffs into the lungs 2 (two) times daily. 1 Inhaler 12 02/01/2015 at Unknown time  . cetirizine (ZYRTEC) 10 MG tablet Take 10 mg by mouth daily as needed for allergies.    Past Month at Unknown time  . cyclobenzaprine (FLEXERIL) 10 MG tablet Take 1 tablet (10 mg total) by mouth once. (Patient taking differently: Take 10 mg by mouth daily as needed for muscle spasms. ) 20 tablet 0 02/02/2015 at Unknown time  . diphenhydrAMINE (BENADRYL) 25 mg capsule Take 25 mg by mouth every 6 (six) hours as needed for itching or sleep.   01/26/2015  . guaifenesin (ROBITUSSIN) 100 MG/5ML syrup Take 300 mg by mouth 3 (three) times daily as needed for cough or congestion.   02/02/2015 at Unknown time  . hydrOXYzine (ATARAX/VISTARIL) 25 MG tablet Take 25 mg by mouth 3 (three) times daily as needed for itching (sleep).   Past Week at  Unknown time  . albuterol (PROVENTIL HFA;VENTOLIN HFA) 108 (90 BASE) MCG/ACT inhaler Inhale 2 puffs into the lungs every 6 (six) hours as needed for wheezing or shortness of breath. 1 Inhaler 1 01/19/2015  . albuterol (PROVENTIL) (2.5 MG/3ML) 0.083% nebulizer solution Take 3 mLs (2.5 mg total) by nebulization every 6 (six) hours as needed for wheezing or shortness of breath. 75 mL 1 emergency  . zolpidem (AMBIEN) 10 MG tablet Take 0.5 tablets (5 mg total) by mouth at bedtime as needed for sleep. (Patient not taking: Reported on 02/02/2015) 15 tablet 1     Review of Systems  Constitutional: Positive for malaise/fatigue. Negative for fever and chills.  Eyes: Negative for blurred vision.  Respiratory: Negative for shortness of breath.   Cardiovascular: Negative for chest pain.  Gastrointestinal: Positive for nausea,  vomiting, abdominal pain and diarrhea. Negative for constipation and blood in stool.  Genitourinary: Negative for dysuria.  Musculoskeletal: Positive for joint pain (foot sore). Negative for myalgias and back pain.  Neurological: Negative for dizziness and headaches.   Physical Exam   Blood pressure 118/72, pulse 80, temperature 97.4 F (36.3 C), temperature source Oral, resp. rate 18, last menstrual period 04/27/2014.  Physical Exam  Constitutional: She is oriented to person, place, and time. She appears well-developed and well-nourished. No distress.  HENT:  Head: Normocephalic.  Cardiovascular: Normal rate, regular rhythm and normal heart sounds.   Respiratory: Effort normal. No respiratory distress.  GI: Soft. She exhibits no distension. There is no tenderness. There is no rebound and no guarding.  Musculoskeletal: Normal range of motion.  Neurological: She is alert and oriented to person, place, and time.  Skin: Skin is warm and dry.  Psychiatric: She has a normal mood and affect.   Fetal heart rate reactive, Category I Occasional irregular contractions  Cervix closed, soft  MAU Course  Procedures  MDM  Results for orders placed or performed during the hospital encounter of 02/02/15 (from the past 72 hour(s))  Urinalysis, Routine w reflex microscopic (not at [facility])     Status: Abnormal   Collection Time: 02/02/15 11:17 AM  Result Value Ref Range   Color, Urine YELLOW YELLOW   APPearance CLEAR CLEAR   Specific Gravity, Urine 1.020 1.005 - 1.030   pH 6.5 5.0 - 8.0   Glucose, UA NEGATIVE NEGATIVE mg/dL   Hgb urine dipstick NEGATIVE NEGATIVE   Bilirubin Urine NEGATIVE NEGATIVE   Ketones, ur 15 (A) NEGATIVE mg/dL   Protein, ur NEGATIVE NEGATIVE mg/dL   Urobilinogen, UA 0.2 0.0 - 1.0 mg/dL   Nitrite NEGATIVE NEGATIVE   Leukocytes, UA NEGATIVE NEGATIVE    Comment: MICROSCOPIC NOT DONE ON URINES WITH NEGATIVE PROTEIN, BLOOD, LEUKOCYTES, NITRITE, OR GLUCOSE <1000 mg/dL.      Assessment and Plan  A:  SIUP at [redacted]w[redacted]d       Intermittent abdominal pain, possibly contractions or round ligament strain      Occasional N/V/D.  No diarrhea by strict definition. Possible gastroenteritis      Reassuring FHR tracing, Category I      Discomforts of late pregnancy  P;  Discharge home       Push PO fluids and advance diet as tolerated       Labor precautions       Keep appt for OB clinic for prenatal care  [facility] 02/02/2015, 1:25 PM

## 2015-02-02 NOTE — Discharge Instructions (Signed)
Nausea and Vomiting °Nausea is a sick feeling that often comes before throwing up (vomiting). Vomiting is a reflex where stomach contents come out of your mouth. Vomiting can cause severe loss of body fluids (dehydration). Children and elderly adults can become dehydrated quickly, especially if they also have diarrhea. Nausea and vomiting are symptoms of a condition or disease. It is important to find the cause of your symptoms. °CAUSES  °· Direct irritation of the stomach lining. This irritation can result from increased acid production (gastroesophageal reflux disease), infection, food poisoning, taking certain medicines (such as nonsteroidal anti-inflammatory drugs), alcohol use, or tobacco use. °· Signals from the brain. These signals could be caused by a headache, heat exposure, an inner ear disturbance, increased pressure in the brain from injury, infection, a tumor, or a concussion, pain, emotional stimulus, or metabolic problems. °· An obstruction in the gastrointestinal tract (bowel obstruction). °· Illnesses such as diabetes, hepatitis, gallbladder problems, appendicitis, kidney problems, cancer, sepsis, atypical symptoms of a heart attack, or eating disorders. °· Medical treatments such as chemotherapy and radiation. °· Receiving medicine that makes you sleep (general anesthetic) during surgery. °DIAGNOSIS °Your caregiver may ask for tests to be done if the problems do not improve after a few days. Tests may also be done if symptoms are severe or if the reason for the nausea and vomiting is not clear. Tests may include: °· Urine tests. °· Blood tests. °· Stool tests. °· Cultures (to look for evidence of infection). °· X-rays or other imaging studies. °Test results can help your caregiver make decisions about treatment or the need for additional tests. °TREATMENT °You need to stay well hydrated. Drink frequently but in small amounts. You may wish to drink water, sports drinks, clear broth, or eat frozen  ice pops or gelatin dessert to help stay hydrated. When you eat, eating slowly may help prevent nausea. There are also some antinausea medicines that may help prevent nausea. °HOME CARE INSTRUCTIONS  °· Take all medicine as directed by your caregiver. °· If you do not have an appetite, do not force yourself to eat. However, you must continue to drink fluids. °· If you have an appetite, eat a normal diet unless your caregiver tells you differently. °¨ Eat a variety of complex carbohydrates (rice, wheat, potatoes, bread), lean meats, yogurt, fruits, and vegetables. °¨ Avoid high-fat foods because they are more difficult to digest. °· Drink enough water and fluids to keep your urine clear or pale yellow. °· If you are dehydrated, ask your caregiver for specific rehydration instructions. Signs of dehydration may include: °¨ Severe thirst. °¨ Dry lips and mouth. °¨ Dizziness. °¨ Dark urine. °¨ Decreasing urine frequency and amount. °¨ Confusion. °¨ Rapid breathing or pulse. °SEEK IMMEDIATE MEDICAL CARE IF:  °· You have blood or brown flecks (like coffee grounds) in your vomit. °· You have black or bloody stools. °· You have a severe headache or stiff neck. °· You are confused. °· You have severe abdominal pain. °· You have chest pain or trouble breathing. °· You do not urinate at least once every 8 hours. °· You develop cold or clammy skin. °· You continue to vomit for longer than 24 to 48 hours. °· You have a fever. °MAKE SURE YOU:  °· Understand these instructions. °· Will watch your condition. °· Will get help right away if you are not doing well or get worse. °Document Released: 08/20/2005 Document Revised: 11/12/2011 Document Reviewed: 01/17/2011 °ExitCare® Patient Information ©2015 [employer]. This information is not intended   to replace advice given to you by your health care provider. Make sure you discuss any questions you have with your health care provider. °Viral Gastroenteritis °Viral gastroenteritis is  also known as stomach flu. This condition affects the stomach and intestinal tract. It can cause sudden diarrhea and vomiting. The illness typically lasts 3 to 8 days. Most people develop an immune response that eventually gets rid of the virus. While this natural response develops, the virus can make you quite ill. °CAUSES  °Many different viruses can cause gastroenteritis, such as rotavirus or noroviruses. You can catch one of these viruses by consuming contaminated food or water. You may also catch a virus by sharing utensils or other personal items with an infected person or by touching a contaminated surface. °SYMPTOMS  °The most common symptoms are diarrhea and vomiting. These problems can cause a severe loss of body fluids (dehydration) and a body salt (electrolyte) imbalance. Other symptoms may include: °· Fever. °· Headache. °· Fatigue. °· Abdominal pain. °DIAGNOSIS  °Your caregiver can usually diagnose viral gastroenteritis based on your symptoms and a physical exam. A stool sample may also be taken to test for the presence of viruses or other infections. °TREATMENT  °This illness typically goes away on its own. Treatments are aimed at rehydration. The most serious cases of viral gastroenteritis involve vomiting so severely that you are not able to keep fluids down. In these cases, fluids must be given through an intravenous line (IV). °HOME CARE INSTRUCTIONS  °· Drink enough fluids to keep your urine clear or pale yellow. Drink small amounts of fluids frequently and increase the amounts as tolerated. °· Ask your caregiver for specific rehydration instructions. °· Avoid: °¨ Foods high in sugar. °¨ Alcohol. °¨ Carbonated drinks. °¨ Tobacco. °¨ Juice. °¨ Caffeine drinks. °¨ Extremely hot or cold fluids. °¨ Fatty, greasy foods. °¨ Too much intake of anything at one time. °¨ Dairy products until 24 to 48 hours after diarrhea stops. °· You may consume probiotics. Probiotics are active cultures of beneficial  bacteria. They may lessen the amount and number of diarrheal stools in adults. Probiotics can be found in yogurt with active cultures and in supplements. °· Wash your hands well to avoid spreading the virus. °· Only take over-the-counter or prescription medicines for pain, discomfort, or fever as directed by your caregiver. Do not give aspirin to children. Antidiarrheal medicines are not recommended. °· Ask your caregiver if you should continue to take your regular prescribed and over-the-counter medicines. °· Keep all follow-up appointments as directed by your caregiver. °SEEK IMMEDIATE MEDICAL CARE IF:  °· You are unable to keep fluids down. °· You do not urinate at least once every 6 to 8 hours. °· You develop shortness of breath. °· You notice blood in your stool or vomit. This may look like coffee grounds. °· You have abdominal pain that increases or is concentrated in one small area (localized). °· You have persistent vomiting or diarrhea. °· You have a fever. °· The patient is a child younger than 3 months, and he or she has a fever. °· The patient is a child older than 3 months, and he or she has a fever and persistent symptoms. °· The patient is a child older than 3 months, and he or she has a fever and symptoms suddenly get worse. °· The patient is a baby, and he or she has no tears when crying. °MAKE SURE YOU:  °· Understand these instructions. °· Will watch your   condition.  Will get help right away if you are not doing well or get worse. Document Released: 08/20/2005 Document Revised: 11/12/2011 Document Reviewed: 06/06/2011 [facility] Patient Information 2015 [place], [place]. This information is not intended to replace advice given to you by your health care provider. Make sure you discuss any questions you have with your health care provider.

## 2015-02-02 NOTE — MAU Note (Signed)
Pt presents complaining of increased pelvic pain after stumbling in the kitchen. States she is also having nausea and diarrhea. Took a flexiril this am but it didn't help. States she thinks she is dehydrated.

## 2015-02-03 ENCOUNTER — Telehealth (HOSPITAL_COMMUNITY): Payer: Self-pay | Admitting: *Deleted

## 2015-02-03 ENCOUNTER — Encounter (HOSPITAL_COMMUNITY): Payer: Self-pay | Admitting: *Deleted

## 2015-02-03 NOTE — Telephone Encounter (Signed)
Preadmission screen  

## 2015-02-04 ENCOUNTER — Ambulatory Visit (INDEPENDENT_AMBULATORY_CARE_PROVIDER_SITE_OTHER): Payer: Medicaid Other | Admitting: Obstetrics & Gynecology

## 2015-02-04 ENCOUNTER — Encounter: Payer: Medicaid Other | Admitting: Obstetrics & Gynecology

## 2015-02-04 VITALS — BP 114/71 | HR 97

## 2015-02-04 DIAGNOSIS — Z3483 Encounter for supervision of other normal pregnancy, third trimester: Secondary | ICD-10-CM

## 2015-02-04 DIAGNOSIS — O480 Post-term pregnancy: Secondary | ICD-10-CM

## 2015-02-04 DIAGNOSIS — Z3493 Encounter for supervision of normal pregnancy, unspecified, third trimester: Secondary | ICD-10-CM

## 2015-02-04 LAB — POCT URINALYSIS DIP (DEVICE)
BILIRUBIN URINE: NEGATIVE
Glucose, UA: NEGATIVE mg/dL
Hgb urine dipstick: NEGATIVE
KETONES UR: NEGATIVE mg/dL
NITRITE: NEGATIVE
Protein, ur: NEGATIVE mg/dL
SPECIFIC GRAVITY, URINE: 1.02 (ref 1.005–1.030)
Urobilinogen, UA: 0.2 mg/dL (ref 0.0–1.0)
pH: 7 (ref 5.0–8.0)

## 2015-02-04 MED ORDER — ZOLPIDEM TARTRATE 10 MG PO TABS: 5.0000 mg | ORAL_TABLET | Freq: Every evening | ORAL | Status: DC | PRN

## 2015-02-04 NOTE — Progress Notes (Signed)
Routine visit. Good FM. No problems except difficulty sleeping, requesting ambien. 5 mg hs prn. IOL scheduled. Membranes stripped.

## 2015-02-04 NOTE — Progress Notes (Signed)
Pt has had 2 MAU visits since last clinic visit.  Pt states she was not given printed Rx for Ambien @ visit on 5/28.  She is still having difficulty sleeping.  Rx given.  IOL scheduled on 6/7.

## 2015-02-06 ENCOUNTER — Inpatient Hospital Stay (HOSPITAL_COMMUNITY)
Admission: AD | Admit: 2015-02-06 | Discharge: 2015-02-09 | DRG: 775 | Disposition: A | Discharge status: Home / Self Care | Payer: Medicaid Other | Source: Ambulatory Visit | Attending: Obstetrics & Gynecology | Admitting: Obstetrics & Gynecology

## 2015-02-06 DIAGNOSIS — R109 Unspecified abdominal pain: Secondary | ICD-10-CM

## 2015-02-06 DIAGNOSIS — Z3A40 40 weeks gestation of pregnancy: Secondary | ICD-10-CM | POA: Diagnosis present

## 2015-02-06 DIAGNOSIS — Z3493 Encounter for supervision of normal pregnancy, unspecified, third trimester: Secondary | ICD-10-CM

## 2015-02-06 DIAGNOSIS — O26899 Other specified pregnancy related conditions, unspecified trimester: Secondary | ICD-10-CM

## 2015-02-06 DIAGNOSIS — O480 Post-term pregnancy: Principal | ICD-10-CM | POA: Diagnosis present

## 2015-02-07 ENCOUNTER — Inpatient Hospital Stay (HOSPITAL_COMMUNITY): Payer: Medicaid Other | Admitting: Anesthesiology

## 2015-02-07 ENCOUNTER — Encounter (HOSPITAL_COMMUNITY): Payer: Self-pay | Admitting: *Deleted

## 2015-02-07 DIAGNOSIS — R109 Unspecified abdominal pain: Secondary | ICD-10-CM

## 2015-02-07 DIAGNOSIS — O480 Post-term pregnancy: Secondary | ICD-10-CM | POA: Diagnosis present

## 2015-02-07 DIAGNOSIS — O26899 Other specified pregnancy related conditions, unspecified trimester: Secondary | ICD-10-CM

## 2015-02-07 DIAGNOSIS — Z3483 Encounter for supervision of other normal pregnancy, third trimester: Secondary | ICD-10-CM | POA: Diagnosis present

## 2015-02-07 DIAGNOSIS — Z3A40 40 weeks gestation of pregnancy: Secondary | ICD-10-CM | POA: Diagnosis present

## 2015-02-07 LAB — CBC
HEMATOCRIT: 35.9 % — AB (ref 36.0–46.0)
Hemoglobin: 12 g/dL (ref 12.0–15.0)
MCH: 28.2 pg (ref 26.0–34.0)
MCHC: 33.4 g/dL (ref 30.0–36.0)
MCV: 84.5 fL (ref 78.0–100.0)
PLATELETS: 227 10*3/uL (ref 150–400)
RBC: 4.25 MIL/uL (ref 3.87–5.11)
RDW: 14.4 % (ref 11.5–15.5)
WBC: 8 10*3/uL (ref 4.0–10.5)

## 2015-02-07 LAB — HIV ANTIBODY (ROUTINE TESTING W REFLEX): HIV SCREEN 4TH GENERATION: NONREACTIVE

## 2015-02-07 LAB — POCT FERN TEST: POCT Fern Test: POSITIVE

## 2015-02-07 LAB — RPR: RPR: NONREACTIVE

## 2015-02-07 MED ORDER — ONDANSETRON HCL 4 MG/2ML IJ SOLN: 4.0000 mg | Freq: Four times a day (QID) | INTRAMUSCULAR | Status: DC | PRN

## 2015-02-07 MED ORDER — DIBUCAINE 1 % RE OINT: 1.0000 "application " | TOPICAL_OINTMENT | RECTAL | Status: DC | PRN

## 2015-02-07 MED ORDER — OXYCODONE-ACETAMINOPHEN 5-325 MG PO TABS: 2.0000 | ORAL_TABLET | ORAL | Status: DC | PRN

## 2015-02-07 MED ORDER — METHYLERGONOVINE MALEATE 0.2 MG/ML IJ SOLN
INTRAMUSCULAR | Status: AC
  Administered 2015-02-07: 0.2 mg
  Filled 2015-02-07: qty 1

## 2015-02-07 MED ORDER — OXYTOCIN BOLUS FROM INFUSION: 500.0000 mL | INTRAVENOUS | Status: DC

## 2015-02-07 MED ORDER — SODIUM CHLORIDE 0.9 % IV SOLN
2.0000 g | Freq: Once | INTRAVENOUS | Status: DC
  Filled 2015-02-07: qty 2000

## 2015-02-07 MED ORDER — OXYCODONE-ACETAMINOPHEN 5-325 MG PO TABS: 1.0000 | ORAL_TABLET | ORAL | Status: DC | PRN

## 2015-02-07 MED ORDER — IBUPROFEN 600 MG PO TABS: 600.0000 mg | ORAL_TABLET | Freq: Four times a day (QID) | ORAL | Status: DC

## 2015-02-07 MED ORDER — ONDANSETRON HCL 4 MG/2ML IJ SOLN: 4.0000 mg | INTRAMUSCULAR | Status: DC | PRN

## 2015-02-07 MED ORDER — OXYTOCIN 40 UNITS IN LACTATED RINGERS INFUSION - SIMPLE MED
62.5000 mL/h | INTRAVENOUS | Status: DC
  Filled 2015-02-07: qty 1000

## 2015-02-07 MED ORDER — SIMETHICONE 80 MG PO CHEW: 80.0000 mg | CHEWABLE_TABLET | ORAL | Status: DC | PRN

## 2015-02-07 MED ORDER — ONDANSETRON HCL 4 MG PO TABS: 4.0000 mg | ORAL_TABLET | ORAL | Status: DC | PRN

## 2015-02-07 MED ORDER — LACTATED RINGERS IV SOLN
INTRAVENOUS | Status: DC
  Administered 2015-02-07: 02:00:00 via INTRAVENOUS

## 2015-02-07 MED ORDER — DIBUCAINE 1 % RE OINT
1.0000 "application " | TOPICAL_OINTMENT | RECTAL | Status: DC | PRN
  Filled 2015-02-07: qty 28

## 2015-02-07 MED ORDER — ZOLPIDEM TARTRATE 5 MG PO TABS: 5.0000 mg | ORAL_TABLET | Freq: Every evening | ORAL | Status: DC | PRN

## 2015-02-07 MED ORDER — TERBUTALINE SULFATE 1 MG/ML IJ SOLN
0.2500 mg | Freq: Once | INTRAMUSCULAR | Status: DC | PRN
  Filled 2015-02-07: qty 1

## 2015-02-07 MED ORDER — LACTATED RINGERS IV SOLN
500.0000 mL | INTRAVENOUS | Status: DC | PRN
  Administered 2015-02-07: 500 mL via INTRAVENOUS

## 2015-02-07 MED ORDER — FENTANYL 2.5 MCG/ML BUPIVACAINE 1/10 % EPIDURAL INFUSION (WH - ANES): 12.0000 mL/h | INTRAMUSCULAR | Status: DC | PRN

## 2015-02-07 MED ORDER — SODIUM CHLORIDE 0.9 % IV SOLN: 250.0000 mL | INTRAVENOUS | Status: DC | PRN

## 2015-02-07 MED ORDER — WITCH HAZEL-GLYCERIN EX PADS: 1.0000 "application " | MEDICATED_PAD | CUTANEOUS | Status: DC | PRN

## 2015-02-07 MED ORDER — BENZOCAINE-MENTHOL 20-0.5 % EX AERO: 1.0000 "application " | INHALATION_SPRAY | CUTANEOUS | Status: DC | PRN

## 2015-02-07 MED ORDER — ACETAMINOPHEN 325 MG PO TABS: 650.0000 mg | ORAL_TABLET | ORAL | Status: DC | PRN

## 2015-02-07 MED ORDER — SODIUM CHLORIDE 0.9 % IJ SOLN: 3.0000 mL | Freq: Two times a day (BID) | INTRAMUSCULAR | Status: DC

## 2015-02-07 MED ORDER — DIPHENHYDRAMINE HCL 25 MG PO CAPS
25.0000 mg | ORAL_CAPSULE | Freq: Four times a day (QID) | ORAL | Status: DC | PRN
  Administered 2015-02-07 – 2015-02-08 (×4): 25 mg via ORAL
  Filled 2015-02-07 (×4): qty 1

## 2015-02-07 MED ORDER — PRENATAL MULTIVITAMIN CH
1.0000 | ORAL_TABLET | Freq: Every day | ORAL | Status: DC
Start: 2015-02-07 — End: 2015-02-09
  Administered 2015-02-07 – 2015-02-09 (×3): 1 via ORAL
  Filled 2015-02-07 (×3): qty 1

## 2015-02-07 MED ORDER — IBUPROFEN 600 MG PO TABS
600.0000 mg | ORAL_TABLET | Freq: Four times a day (QID) | ORAL | Status: DC
Start: 2015-02-07 — End: 2015-02-09
  Administered 2015-02-07 – 2015-02-09 (×9): 600 mg via ORAL
  Filled 2015-02-07 (×9): qty 1

## 2015-02-07 MED ORDER — SENNOSIDES-DOCUSATE SODIUM 8.6-50 MG PO TABS: 2.0000 | ORAL_TABLET | ORAL | Status: DC

## 2015-02-07 MED ORDER — ZOLPIDEM TARTRATE 5 MG PO TABS
5.0000 mg | ORAL_TABLET | Freq: Every evening | ORAL | Status: DC | PRN
Start: 2015-02-07 — End: 2015-02-07

## 2015-02-07 MED ORDER — DIPHENHYDRAMINE HCL 50 MG/ML IJ SOLN: 12.5000 mg | INTRAMUSCULAR | Status: DC | PRN

## 2015-02-07 MED ORDER — DIPHENHYDRAMINE HCL 25 MG PO CAPS: 25.0000 mg | ORAL_CAPSULE | Freq: Four times a day (QID) | ORAL | Status: DC | PRN

## 2015-02-07 MED ORDER — PHENYLEPHRINE 40 MCG/ML (10ML) SYRINGE FOR IV PUSH (FOR BLOOD PRESSURE SUPPORT)
80.0000 ug | PREFILLED_SYRINGE | INTRAVENOUS | Status: DC | PRN
  Filled 2015-02-07: qty 20
  Filled 2015-02-07: qty 2

## 2015-02-07 MED ORDER — BENZOCAINE-MENTHOL 20-0.5 % EX AERO
1.0000 "application " | INHALATION_SPRAY | CUTANEOUS | Status: DC | PRN
  Administered 2015-02-07: 1 via TOPICAL
  Filled 2015-02-07: qty 56

## 2015-02-07 MED ORDER — LIDOCAINE HCL (PF) 1 % IJ SOLN
INTRAMUSCULAR | Status: DC | PRN
  Administered 2015-02-07: 3 mL
  Administered 2015-02-07: 4 mL

## 2015-02-07 MED ORDER — PHENYLEPHRINE 40 MCG/ML (10ML) SYRINGE FOR IV PUSH (FOR BLOOD PRESSURE SUPPORT): 80.0000 ug | PREFILLED_SYRINGE | INTRAVENOUS | Status: DC | PRN

## 2015-02-07 MED ORDER — PRENATAL MULTIVITAMIN CH: 1.0000 | ORAL_TABLET | Freq: Every day | ORAL | Status: DC

## 2015-02-07 MED ORDER — OXYTOCIN 40 UNITS IN LACTATED RINGERS INFUSION - SIMPLE MED: 62.5000 mL/h | INTRAVENOUS | Status: DC | PRN

## 2015-02-07 MED ORDER — LIDOCAINE HCL (PF) 1 % IJ SOLN
30.0000 mL | INTRAMUSCULAR | Status: DC | PRN
  Filled 2015-02-07: qty 30

## 2015-02-07 MED ORDER — SENNOSIDES-DOCUSATE SODIUM 8.6-50 MG PO TABS
2.0000 | ORAL_TABLET | ORAL | Status: DC
  Administered 2015-02-07 – 2015-02-08 (×2): 2 via ORAL
  Filled 2015-02-07 (×3): qty 2

## 2015-02-07 MED ORDER — PNEUMOCOCCAL VAC POLYVALENT 25 MCG/0.5ML IJ INJ
0.5000 mL | INJECTION | INTRAMUSCULAR | Status: AC
  Administered 2015-02-09: 0.5 mL via INTRAMUSCULAR
  Filled 2015-02-07: qty 0.5

## 2015-02-07 MED ORDER — NALBUPHINE HCL 10 MG/ML IJ SOLN: 10.0000 mg | INTRAMUSCULAR | Status: DC | PRN

## 2015-02-07 MED ORDER — TETANUS-DIPHTH-ACELL PERTUSSIS 5-2.5-18.5 LF-MCG/0.5 IM SUSP: 0.5000 mL | Freq: Once | INTRAMUSCULAR | Status: DC

## 2015-02-07 MED ORDER — FENTANYL 2.5 MCG/ML BUPIVACAINE 1/10 % EPIDURAL INFUSION (WH - ANES)
14.0000 mL/h | INTRAMUSCULAR | Status: DC | PRN
  Administered 2015-02-07: 14 mL/h via EPIDURAL
  Administered 2015-02-07: 12 mL/h via EPIDURAL
  Filled 2015-02-07: qty 125

## 2015-02-07 MED ORDER — OXYCODONE-ACETAMINOPHEN 5-325 MG PO TABS
2.0000 | ORAL_TABLET | ORAL | Status: DC | PRN
  Administered 2015-02-07 – 2015-02-09 (×5): 2 via ORAL
  Filled 2015-02-07 (×5): qty 2

## 2015-02-07 MED ORDER — LANOLIN HYDROUS EX OINT: TOPICAL_OINTMENT | CUTANEOUS | Status: DC | PRN

## 2015-02-07 MED ORDER — SODIUM CHLORIDE 0.9 % IJ SOLN: 3.0000 mL | INTRAMUSCULAR | Status: DC | PRN

## 2015-02-07 MED ORDER — CITRIC ACID-SODIUM CITRATE 334-500 MG/5ML PO SOLN: 30.0000 mL | ORAL | Status: DC | PRN

## 2015-02-07 MED ORDER — EPHEDRINE 5 MG/ML INJ
10.0000 mg | INTRAVENOUS | Status: DC | PRN
  Filled 2015-02-07: qty 2

## 2015-02-07 MED ORDER — LANOLIN HYDROUS EX OINT
TOPICAL_OINTMENT | CUTANEOUS | Status: DC | PRN
Start: 2015-02-07 — End: 2015-02-09

## 2015-02-07 MED ORDER — OXYTOCIN 40 UNITS IN LACTATED RINGERS INFUSION - SIMPLE MED
1.0000 m[IU]/min | INTRAVENOUS | Status: DC
Start: 2015-02-07 — End: 2015-02-07
  Administered 2015-02-07: 2 m[IU]/min via INTRAVENOUS

## 2015-02-07 NOTE — H&P (Signed)
Sherry Ferguson is a 29 y.o. female presenting for  SROM.Not having contractions History OB History    Gravida Para Term Preterm AB TAB SAB Ectopic Multiple Living   3 2 2       2      Past Medical History  Diagnosis Date  . Allergy   . Asthma   . IUD (intrauterine device) in place     no longer has IUD- removed in 2015  . Bronchitis, acute    Past Surgical History  Procedure Laterality Date  . No past surgeries     Family History: family history includes Asthma in her mother; Bronchitis in her father; Diabetes in her father; Hyperlipidemia in her father; Hypertension in her father. Social History:  reports that she quit smoking about 7 years ago. Her smoking use included Cigarettes. She has never used smokeless tobacco. She reports that she does not drink alcohol or use illicit drugs.   Prenatal Transfer Tool  Maternal Diabetes: No Genetic Screening: Normal Maternal Ultrasounds/Referrals: Normal Fetal Ultrasounds or other Referrals:  None Maternal Substance Abuse:  No Significant Maternal Medications:  None Significant Maternal Lab Results:  None Other Comments:  None  Review of Systems  Constitutional: Negative.   HENT: Negative.   Eyes: Negative.   Respiratory: Negative.   Cardiovascular: Negative.   Gastrointestinal: Negative.   Genitourinary: Negative.   Musculoskeletal: Negative.   Skin: Negative.   Neurological: Negative.   Endo/Heme/Allergies: Negative.   Psychiatric/Behavioral: Negative.       Blood pressure 109/69, pulse 94, temperature 98.2 F (36.8 C), temperature source Oral, resp. rate 16, last menstrual period 04/27/2014. Maternal Exam:  Uterine Assessment: Contraction strength is mild.  Contraction frequency is rare.   Abdomen: Patient reports no abdominal tenderness. Fetal presentation: vertex  Introitus: Normal vulva. Normal vagina.  Ferning test: positive.  Amniotic fluid character: clear.  Pelvis: adequate for delivery.   Cervix: Cervix  evaluated by digital exam.     Fetal Exam Fetal Monitor Review: Mode: ultrasound.   Variability: moderate (6-25 bpm).   Pattern: accelerations present.    Fetal State Assessment: Category I - tracings are normal.     Physical Exam  Constitutional: She is oriented to person, place, and time. She appears well-developed and well-nourished.  HENT:  Head: Normocephalic.  Eyes: Pupils are equal, round, and reactive to light.  Neck: Normal range of motion. Neck supple.  Cardiovascular: Normal rate, regular rhythm, normal heart sounds and intact distal pulses.   Respiratory: Effort normal and breath sounds normal.  GI: Soft. Bowel sounds are normal.  Genitourinary: Vagina normal and uterus normal.  Musculoskeletal: Normal range of motion.  Neurological: She is alert and oriented to person, place, and time. She has normal reflexes.  Skin: Skin is warm and dry.  Psychiatric: She has a normal mood and affect. Her behavior is normal. Judgment and thought content normal.    Prenatal labs: ABO, Rh: B/POS/-- (09/28 1436) Antibody: NEG (09/28 1436) Rubella: 1.08 (09/28 1436) RPR: NON REAC (03/04 1057)  HBsAg: NEGATIVE (09/28 1436)  HIV: NONREACTIVE (03/04 1057)  GBS: Negative (05/09 0000)   Assessment/Plan: Admit to labor and delivery   Sherry Ferguson 02/07/2015, 12:59 AM

## 2015-02-07 NOTE — Anesthesia Postprocedure Evaluation (Signed)
Anesthesia Post Note  Patient: Sherry Ferguson  Procedure(s) Performed: * No procedures listed *  Anesthesia type: Epidural  Patient location: Mother/Baby  Post pain: Pain level controlled  Post assessment: Post-op Vital signs reviewed  Last Vitals:  Filed Vitals:   02/07/15 1015  BP: 119/68  Pulse: 83  Temp: 36.3 C  Resp: 20    Post vital signs: Reviewed  Level of consciousness:alert  Complications: No apparent anesthesia complications

## 2015-02-07 NOTE — Anesthesia Preprocedure Evaluation (Signed)
Anesthesia Evaluation  Patient identified by MRN, date of birth, ID band Patient awake    Reviewed: Allergy & Precautions, Patient's Chart, lab work & pertinent test results  Airway Mallampati: III  TM Distance: >3 FB Neck ROM: Full    Dental no notable dental hx. (+) Teeth Intact   Pulmonary asthma , former smoker,  breath sounds clear to auscultation  Pulmonary exam normal       Cardiovascular negative cardio ROS  Rhythm:Regular Rate:Normal     Neuro/Psych negative neurological ROS  negative psych ROS   GI/Hepatic negative GI ROS, Neg liver ROS,   Endo/Other  Morbid obesity  Renal/GU negative Renal ROS  negative genitourinary   Musculoskeletal negative musculoskeletal ROS (+)   Abdominal (+) + obese,   Peds  Hematology negative hematology ROS (+)   Anesthesia Other Findings   Reproductive/Obstetrics (+) Pregnancy                             Anesthesia Physical Anesthesia Plan  ASA: III  Anesthesia Plan: Epidural   Post-op Pain Management:    Induction:   Airway Management Planned: Natural Airway  Additional Equipment:   Intra-op Plan:   Post-operative Plan:   Informed Consent: I have reviewed the patients History and Physical, chart, labs and discussed the procedure including the risks, benefits and alternatives for the proposed anesthesia with the patient or authorized representative who has indicated his/her understanding and acceptance.     Plan Discussed with: Anesthesiologist  Anesthesia Plan Comments:         Anesthesia Quick Evaluation

## 2015-02-07 NOTE — Lactation Note (Signed)
This note was copied from the chart of Girl Joline Salticole Vanatta. Lactation Consultation Note  Patient Name: Girl Joline Salticole Silvers ZOXWR'UToday's Date: 02/07/2015 Reason for consult: Initial assessment Baby 8 hours old. Mom reports that she nursed her two older children six months each. Mom states her milk supply slowly decreased as she supplemented with formula. Discussed supply and demand. Assisted to position mom for latching in football position to left breast. Baby latched deeply, suckling rhythmically with a few swallows noted. Enc mom to nurse with cues, and alternate the breast with which she begins each feeding. Mom given [facility] broch[facility], aware of OP/BFSG, community resources, and [facility] phone line assistance after D/C.   Maternal Data Has patient been taught Hand Expression?: Yes (Per mom.) Does the patient have breastfeeding experience prior to this delivery?: Yes  Feeding Feeding Type: Breast Fed Length of feed:  (Assessed first 10 minutes of BF.)  LATCH Score/Interventions Latch: Grasps breast easily, tongue down, lips flanged, rhythmical sucking.  Audible Swallowing: A few with stimulation  Type of Nipple: Everted at rest and after stimulation  Comfort (Breast/Nipple): Soft / non-tender     Hold (Positioning): Assistance needed to correctly position infant at breast and maintain latch. Intervention(s): Breastfeeding basics reviewed;Support Pillows  LATCH Score: 8  Lactation Tools Discussed/Used     Consult Status Consult Status: Follow-up Date: 02/08/15 Follow-up type: In-patient    Geralynn OchsWILLIARD, Hyatt Capobianco 02/07/2015, 3:38 PM

## 2015-02-07 NOTE — Progress Notes (Signed)
Sherry Ferguson is a 29 y.o. G3P2002 at 3593w6d by ultrasound admitted for rupture of membranes  Subjective:   Objective: BP 140/93 mmHg  Pulse 98  Temp(Src) 97.6 F (36.4 C) (Oral)  Resp 16  Ht 5\' 5"  (1.651 m)  Wt 286 lb (129.729 kg)  BMI 47.59 kg/m2  LMP 04/27/2014      FHT:  FHR: 140 bpm, variability: moderate,  accelerations:  Present,  decelerations:  Present variable UC:   regular, every 2-4 minutes SVE:   Dilation: 4 Effacement (%): 60, 70 Station: -3 Exam by:: Miachel Rouxarmen Blair RN  Labs: Lab Results  Component Value Date   WBC 8.0 02/07/2015   HGB 12.0 02/07/2015   HCT 35.9* 02/07/2015   MCV 84.5 02/07/2015   PLT 227 02/07/2015    Assessment / Plan: Protracted latent phase  Labor: Progressing normally Preeclampsia:  no signs or symptoms of toxicity Fetal Wellbeing:  Category II Pain Control:  Epidural I/D:  n/a Anticipated MOD:  NSVD  Sherry Ferguson 02/07/2015, 6:37 AM

## 2015-02-07 NOTE — Plan of Care (Signed)
Problem: Consults Goal: Birthing Suites Patient Information Press F2 to bring up selections list  Outcome: Completed/Met Date Met:  02/07/15  Pt > [redacted] weeks EGA

## 2015-02-07 NOTE — Anesthesia Procedure Notes (Signed)
Epidural Patient location during procedure: OB Start time: 02/07/2015 6:25 AM  Staffing Anesthesiologist: Mal AmabileFOSTER, Shirelle Tootle Performed by: anesthesiologist   Preanesthetic Checklist Completed: patient identified, site marked, surgical consent, pre-op evaluation, timeout performed, IV checked, risks and benefits discussed and monitors and equipment checked  Epidural Patient position: sitting Prep: site prepped and draped and DuraPrep Patient monitoring: continuous pulse ox and blood pressure Approach: midline Location: L3-L4 Injection technique: LOR air  Needle:  Needle type: Tuohy  Needle gauge: 17 G Needle length: 9 cm and 9 Needle insertion depth: 9 cm Catheter type: closed end flexible Catheter size: 19 Gauge Catheter at skin depth: 14 cm Test dose: negative and Other  Assessment Events: blood not aspirated, injection not painful, no injection resistance, negative IV test and no paresthesia  Additional Notes Patient identified. Risks and benefits discussed including failed block, incomplete  Pain control, post dural puncture headache, nerve damage, paralysis, blood pressure Changes, nausea, vomiting, reactions to medications-both toxic and allergic and post Partum back pain. All questions were answered. Patient expressed understanding and wished to proceed. Sterile technique was used throughout procedure. Epidural site was Dressed with sterile barrier dressing. No paresthesias, signs of intravascular injection Or signs of intrathecal spread were encountered.  Patient was more comfortable after the epidural was dosed. Please see RN's note for documentation of vital signs and FHR which are stable.

## 2015-02-08 ENCOUNTER — Encounter (HOSPITAL_COMMUNITY): Payer: Self-pay | Admitting: Student

## 2015-02-08 ENCOUNTER — Inpatient Hospital Stay (HOSPITAL_COMMUNITY): Admission: RE | Admit: 2015-02-08 | Payer: Medicaid Other | Source: Ambulatory Visit

## 2015-02-08 LAB — CBC
HCT: 31.3 % — ABNORMAL LOW (ref 36.0–46.0)
Hemoglobin: 10.2 g/dL — ABNORMAL LOW (ref 12.0–15.0)
MCH: 27.8 pg (ref 26.0–34.0)
MCHC: 32.6 g/dL (ref 30.0–36.0)
MCV: 85.3 fL (ref 78.0–100.0)
Platelets: 179 10*3/uL (ref 150–400)
RBC: 3.67 MIL/uL — AB (ref 3.87–5.11)
RDW: 14.3 % (ref 11.5–15.5)
WBC: 8.4 10*3/uL (ref 4.0–10.5)

## 2015-02-08 NOTE — Progress Notes (Signed)
Post Partum Day 1 Subjective:  Sherry Ferguson is a 29 y.o. [phone] [id number] 169w6d s/p spontaneous vaginal delivery.  No acute events overnight.  Pt denies problems with voiding or po intake.  She denies nausea or vomiting.  Pain is moderately controlled with Motrin. Writer informed pt PRN Percocet is available for pain. She has had flatus. She has not had bowel movement.  Lochia Moderate.  Plan for birth control is vasectomy.  Method of Feeding: Breast. Some trouble latching today; baby girl latched well yesterday.  Pt endorsing dull, non-pleuritic right lower sternum chest pain and dyspnea with ambulation. Denies leg pain.   Objective: Blood pressure 100/53, pulse 77, temperature 98.2 F (36.8 C), temperature source Oral, resp. rate 18, height 5\' 5"  (1.651 m), weight 129.729 kg (286 lb), last menstrual period 04/27/2014, SpO2 100 %, unknown if currently breastfeeding.  Physical Exam:  General: alert, cooperative and no distress Lochia:normal flow Chest: CTAB Abdomen: +BS, soft, nontender Uterine Fundus: difficult to palpate 2/2 body habitus DVT Evaluation: No evidence of DVT seen on physical exam. Extremities: R lower extremity swelling, less compared to pregnancy per pt   Recent Labs  02/07/15 0130 02/08/15 0540  HGB 12.0 10.2*  HCT 35.9* 31.3*    Assessment/Plan:  ASSESSMENT: Sherry Ferguson is a 29 y.o. G3P3003 1869w6d s/p spontaneous vaginal delivery.   Pt is more comfortable with d/c tomorrow vs today.  Plan for discharge tomorrow and Lactation consult. Pt will try Percocet for pain.  Monitor chest pain, dyspnea a/w ambulation.    LOS: 1 day   Jingpeng He Medical Student 02/08/2015, 7:46 AM    OB fellow attestation Post Partum Day 1 I have seen and examined this patient and agree with above documentation in the student's note.   Sherry Ferguson is a 29 y.o. G3P3003 s/p NSVD.  Pt denies problems with ambulating, voiding or po intake. Pain is well controlled.  Plan for  birth control is undecided.  Method of Feeding: breast  PE:  BP 100/53 mmHg  Pulse 77  Temp(Src) 98.2 F (36.8 C) (Oral)  Resp 18  Ht 5\' 5"  (1.651 m)  Wt 286 lb (129.729 kg)  BMI 47.59 kg/m2  SpO2 100%  LMP 04/27/2014  Breastfeeding? Unknown Fundus firm  Plan for discharge: PPD#2  William DaltonMcEachern, Nike Southers, MD 8:52 AM

## 2015-02-08 NOTE — Lactation Note (Signed)
This note was copied from the chart of Sherry Ferguson. Lactation Consultation Note Mom sleepy, laying in bed BF baby in side lying position. States BF going good. Has no questions. Mom states going to Breast and bottle but mostly breast feed. Explained formula feeding will cut down her milk supply and I saw she has a hx of low milk supply with her other children. Mom stated well I'm mainly going to breast feed so it shouldn't be a problem this time. Baby was swaddled in blanket at breast, room warm. Encouraged to unwrap during feeding.  Mom not acting interested in talking to me. Mom encouraged to feed baby 8-12 times/24 hours and with feeding cues. Mom encouraged to waken baby for feeds. Educated about newborn behavior. Mom encouraged to do skin-to-skin.[facility] brochure given w/resources, support groups and LC services.Reviewed Baby & Me book's Breastfeeding Basics.  Patient Name: Sherry Joline Salticole Schnick ZOXWR'UToday's Date: 02/08/2015 Reason for consult: Initial assessment   Maternal Data    Feeding Feeding Type: Breast Fed Length of feed: 10 min  LATCH Score/Interventions Latch: Grasps breast easily, tongue down, lips flanged, rhythmical sucking.     Type of Nipple: Everted at rest and after stimulation  Comfort (Breast/Nipple): Soft / non-tender     Hold (Positioning): No assistance needed to correctly position infant at breast. Intervention(s): Breastfeeding basics reviewed;Support Pillows;Position options;Skin to skin     Lactation Tools Discussed/Used     Consult Status Consult Status: Follow-up Date: 02/09/15 Follow-up type: In-patient    Charyl DancerCARVER, Ansh Fauble G 02/08/2015, 8:25 PM

## 2015-02-08 NOTE — Progress Notes (Signed)
Dr. Delanna AhmadiLawler examined the pt. Chest pain was positional, experienced at rest sitting on edge of bed, reproducible with palpation. Chest pain most likely musculoskeletal, not concerning for pulmonary embolus.

## 2015-02-08 NOTE — Lactation Note (Signed)
This note was copied from the chart of Sherry Sherry Ferguson. Lactation Consultation Note  Patient Name: Sherry Ferguson ZOXWR'UToday's Date: 02/08/2015 Reason for consult: Follow-up assessment Baby 28 hours old. Asked by patient's RN Deven, to assess patient. Mom states that baby has been nursing continuously and screams and cries when she is removed from breast. When LC entered room, baby nursing and has been at breast for last hour and a half. Baby is latched deeply, suckling rhythmically with lips flanged, but no swallows noted. Removed baby from breast to assess nipple, and mom's nipple appears WNL. Mom states that she had milk supply issues with her first two children, but thought this was due to giving them formula. Mom states that she would like to use DEBP. Got mom started using the pump.   Plan is for mom to put baby to breast with feeding cues, supplement with EBM, and then post-pump for 15 minutes. Discussed supplementation guidelines and enc mom to call for assistance as needed.   Discussed assessment and plan with patient's RN Deven.  Maternal Data    Feeding Feeding Type: Breast Milk Length of feed: 60 min  LATCH Score/Interventions Latch: Grasps breast easily, tongue down, lips flanged, rhythmical sucking.  Audible Swallowing: None Intervention(s): Skin to skin;Hand expression  Type of Nipple: Everted at rest and after stimulation  Comfort (Breast/Nipple): Soft / non-tender     Hold (Positioning): No assistance needed to correctly position infant at breast. Intervention(s): Breastfeeding basics reviewed  LATCH Score: 8  Lactation Tools Discussed/Used Tools: Pump Breast pump type: Double-Electric Breast Pump WIC Program: No Pump Review: Setup, frequency, and cleaning;Milk Storage Initiated by:: JW Date initiated:: 02/08/15   Consult Status Consult Status: Follow-up Date: 02/09/15 Follow-up type: In-patient    Geralynn OchsWILLIARD, Particia Strahm 02/08/2015, 12:01 PM

## 2015-02-09 MED ORDER — PRENATAL MULTIVITAMIN CH: 1.0000 | ORAL_TABLET | Freq: Every day | ORAL | Status: DC

## 2015-02-09 MED ORDER — SENNOSIDES-DOCUSATE SODIUM 8.6-50 MG PO TABS
2.0000 | ORAL_TABLET | ORAL | Status: DC | PRN
Start: 2015-02-09 — End: 2015-03-17

## 2015-02-09 MED ORDER — IBUPROFEN 600 MG PO TABS: 600.0000 mg | ORAL_TABLET | Freq: Four times a day (QID) | ORAL | Status: DC

## 2015-02-09 NOTE — Discharge Summary (Signed)
Obstetric Discharge Summary Reason for Admission: rupture of membranes Prenatal Procedures: NST and ultrasound Intrapartum Procedures: spontaneous vaginal delivery Postpartum Procedures: none Complications-Operative and Postpartum: hemorrhage  Delivery Summary: At 6:58 AM a viable female was delivered via Vaginal, Spontaneous Delivery (Presentation: Left Occiput Anterior). APGAR: 9, 9; weight 6 lb 15.1 oz (3150 g). Placenta status: Intact, Spontaneous. Cord: 3 vessels with the following complications: None. Cord pH: n/a  Anesthesia: Epidural  Episiotomy: None Lacerations: None Suture Repair: n/a Est. Blood Loss (mL): 531  Hospital Course:  Active Problems:   Abdominal pain in pregnancy   NSVD (normal spontaneous vaginal delivery)   Sherry Ferguson is a 29 y.o. [id number] s/p NSVD.  Patient was admitted for SROM.  She has postpartum course that was uncomplicated including no problems with ambulating, PO intake, urination, pain, or bleeding. The pt feels ready to go home and  will be discharged with outpatient follow-up.   Today: No acute events overnight.  Pt denies problems with ambulating, voiding or po intake.  She denies nausea or vomiting.  Pain is well controlled.  She has had flatus. She has not had bowel movement.  Lochia Small.  Plan for birth control is vasectomy.  Method of Feeding: Breast.  Physical Exam:  General: alert, cooperative and no distress Lochia: appropriate Uterine Fundus: firm DVT Evaluation: No evidence of DVT seen on physical exam.  H/H: Lab Results  Component Value Date/Time   HGB 10.2* 02/08/2015 05:40 AM   HCT 31.3* 02/08/2015 05:40 AM    Discharge Diagnoses: Term Pregnancy-delivered  Discharge Information: Date: 02/09/2015 Activity: pelvic rest Diet: routine  Medications: PNV, Ibuprofen and Colace Breast feeding:  Yes Condition: stable Instructions: refer to handout Discharge to: home       Discharge Instructions    Increase  activity slowly    Complete by:  As directed             Medication List    STOP taking these medications        cyclobenzaprine 10 MG tablet  Commonly known as:  FLEXERIL     diphenhydrAMINE 25 mg capsule  Commonly known as:  BENADRYL     oxyCODONE-acetaminophen 5-325 MG per tablet  Commonly known as:  PERCOCET/ROXICET      TAKE these medications        albuterol (2.5 MG/3ML) 0.083% nebulizer solution  Commonly known as:  PROVENTIL  Take 3 mLs (2.5 mg total) by nebulization every 6 (six) hours as needed for wheezing or shortness of breath.     albuterol 108 (90 BASE) MCG/ACT inhaler  Commonly known as:  PROVENTIL HFA;VENTOLIN HFA  Inhale 2 puffs into the lungs every 6 (six) hours as needed for wheezing or shortness of breath.     budesonide-formoterol 160-4.5 MCG/ACT inhaler  Commonly known as:  SYMBICORT  Inhale 2 puffs into the lungs 2 (two) times daily.     guaifenesin 100 MG/5ML syrup  Commonly known as:  ROBITUSSIN  Take 300 mg by mouth 3 (three) times daily as needed for cough or congestion.     hydrOXYzine 25 MG tablet  Commonly known as:  ATARAX/VISTARIL  Take 25 mg by mouth 3 (three) times daily as needed for itching (sleep).     ibuprofen 600 MG tablet  Commonly known as:  ADVIL,MOTRIN  Take 1 tablet (600 mg total) by mouth every 6 (six) hours.     prenatal multivitamin Tabs tablet  Take 1 tablet by mouth daily at 12 noon.  senna-docusate 8.6-50 MG per tablet  Commonly known as:  Senokot-S  Take 2 tablets by mouth as needed for mild constipation.       Follow-up Information    Schedule an appointment as soon as possible for a visit with [facility].   Specialty:  Obstetrics and Gynecology   Why:  for post-partum follow-up   Contact information:   [address] [place] [address] [phone]      Caryl Ada, DO 02/09/2015, 8:59 AM PGY-1, [facility]  I was present for the exam and agree  with above.  [place], CNM 02/10/2015 8:07 AM

## 2015-02-09 NOTE — Discharge Instructions (Signed)

## 2015-03-17 ENCOUNTER — Ambulatory Visit (INDEPENDENT_AMBULATORY_CARE_PROVIDER_SITE_OTHER): Payer: Self-pay | Admitting: Family Medicine

## 2015-03-17 ENCOUNTER — Encounter: Payer: Self-pay | Admitting: Family Medicine

## 2015-03-17 VITALS — BP 108/62 | HR 64 | Temp 97.6°F | Ht 65.0 in | Wt 268.6 lb

## 2015-03-17 DIAGNOSIS — H60391 Other infective otitis externa, right ear: Secondary | ICD-10-CM

## 2015-03-17 MED ORDER — NEOMYCIN-POLYMYXIN-HC 3.5-10000-1 OT SOLN: 4.0000 [drp] | Freq: Four times a day (QID) | OTIC | Status: DC

## 2015-03-17 NOTE — Progress Notes (Signed)
Chief Complaint  Patient presents with  . Ear Pain    right ear only x 2-3 days-went swimming on Saturday. Tender and has a "knot" under it. Feels like she is underwater. Right tonsil is starting to hurt as well.    Right ear started hurting 2 days ago--there is chronic pain like a headache, with intermittent sharp, throbbing pain. She has also had some itching of the ear (which she treated with peroxide). This has gotten a little better, but the pain is worse.  It hurts to pop the ear, hurts to open her mouth, and with chewing.  She feels a knot below her right ear which hurts to the touch.  Denies jaw clicking/popping.  She has some discomfort to press on the ear.  Pain radiates "everywhere", even has some pain in the right side of her throat.  Yesterday she had ringing in the ear, and it sounds like she is underwater.  Sounds are different on the right, less clear, like water in the ear, muffled. She had [place] 3 days prior to the onset of pain.  She has had some runny nose since yesterday, clear.  Denies sinus pain, cough. No fever or chills. She was exposed to some mold yesterday, and is known to be allergic.  Tylenol hasn't been effective.  She is currently nursing, and doesn't know what else she can take.  She has a 736 week old daughter.  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  PMH, PSH, SH reviewed/updated Outpatient Encounter Prescriptions as of 03/17/2015  Medication Sig Note  . acetaminophen (TYLENOL) 500 MG tablet Take 1,000 mg by mouth every 6 (six) hours as needed.   . budesonide-formoterol (SYMBICORT) 160-4.5 MCG/ACT inhaler Inhale 2 puffs into the lungs 2 (two) times daily. 03/17/2015: .  Marland Kitchen. [DISCONTINUED] Prenatal Vit-Fe Fumarate-FA (PRENATAL MULTIVITAMIN) TABS tablet Take 1 tablet by mouth daily at 12 noon.   Marland Kitchen. albuterol (PROVENTIL HFA;VENTOLIN HFA) 108 (90 BASE) MCG/ACT inhaler Inhale 2 puffs into the lungs every 6 (six) hours as needed for  wheezing or shortness of breath. (Patient not taking: Reported on 03/17/2015)   . albuterol (PROVENTIL) (2.5 MG/3ML) 0.083% nebulizer solution Take 3 mLs (2.5 mg total) by nebulization every 6 (six) hours as needed for wheezing or shortness of breath. (Patient not taking: Reported on 03/17/2015) 12/30/2014: .  Marland Kitchen. [DISCONTINUED] guaifenesin (ROBITUSSIN) 100 MG/5ML syrup Take 300 mg by mouth 3 (three) times daily as needed for cough or congestion.   . [DISCONTINUED] hydrOXYzine (ATARAX/VISTARIL) 25 MG tablet Take 25 mg by mouth 3 (three) times daily as needed for itching (sleep).   . [DISCONTINUED] ibuprofen (ADVIL,MOTRIN) 600 MG tablet Take 1 tablet (600 mg total) by mouth every 6 (six) hours.   . [DISCONTINUED] senna-docusate (SENOKOT-S) 8.6-50 MG per tablet Take 2 tablets by mouth as needed for mild constipation. (Patient not taking: Reported on 03/17/2015)    No facility-administered encounter medications on file as of 03/17/2015.   She was unaware [facility] 600mg  [facility]  ROS:  No known fever, chills, dizziness, chest pain, shortness of breath, cough. She has some nausea, but no vomiting. No diarrhea.  No bleeding, bruising, rashes, myalgias, arthralgias. See HPI  PHYSICAL EXAM: BP 108/62 mmHg  Pulse 64  Temp(Src) 97.6 F (36.4 C) (Tympanic)  Ht 5\' 5"  (1.651 m)  Wt 268 lb 9.6 oz (121.836 kg)  BMI 44.70 kg/m2  Breastfeeding? Yes  Well developed, pleasant female in mild discomfort only during ear exam, otherwise in no distress HEENT: PERRL, EOMI, conjunctiva clear.  Left TM and EAC is normal.  R EAC is significantly inflamed and erythematous.  TM appears normal. OP is completely clear without erythema.  She is tender when palpating the anterior portion of the right ear, and somewhat tender at the right TMJ (mostly just posterior to this).  She does have some tender lymphadenopathy just below the ear and submandibular (top of anterior  cervical chain). Heart: regular rate and rhythm without murmur Lungs: clear bilaterally.  ASSESSMENT/PLAN:  Otitis, externa, infective, right - Plan: neomycin-polymyxin-hydrocortisone (CORTISPORIN) otic solution     Use the drops as directed. Use ibuprofen as needed for pain.  [facility] might likely still have your  prescription from [facility] hospital.  This is the same as taking 3 over-the-counter (  each) of ibuprofen/motrin/advil.  It is  every 6 hours, and to be taken with food, only if needed for pain.

## 2015-03-17 NOTE — Patient Instructions (Addendum)
Use the drops as directed (4 drops 3-4 times daily into the right ear; use for 10 days). Use ibuprofen as needed for pain.  [facility] might likely still have your 600[facility].  This is the same as taking 3 over-the-counter (200mg  each) of ibuprofen/motrin/advil.  It is 600mg  every 6 hours, and to be taken with food, only if needed for pain.    Otitis Externa Otitis externa is a bacterial or fungal infection of the outer ear canal. This is the area from the eardrum to the outside of the ear. Otitis externa is sometimes called "swimmer's ear." CAUSES  Possible causes of infection include:  Swimming in dirty water.  Moisture remaining in the ear after swimming or bathing.  Mild injury (trauma) to the ear.  Objects stuck in the ear (foreign body).  Cuts or scrapes (abrasions) on the outside of the ear. SIGNS AND SYMPTOMS  The first symptom of infection is often itching in the ear canal. Later signs and symptoms may include swelling and redness of the ear canal, ear pain, and yellowish-white fluid (pus) coming from the ear. The ear pain may be worse when pulling on the earlobe. DIAGNOSIS  Your health care provider will perform a physical exam. A sample of fluid may be taken from the ear and examined for bacteria or fungi. TREATMENT  Antibiotic ear drops are often given for 10 to 14 days. Treatment may also include pain medicine or corticosteroids to reduce itching and swelling. HOME CARE INSTRUCTIONS   Apply antibiotic ear drops to the ear canal as prescribed by your health care provider.  Take medicines only as directed by your health care provider.  If you have diabetes, follow any additional treatment instructions from your health care provider.  Keep all follow-up visits as directed by your health care provider. PREVENTION   Keep your ear dry. Use the corner of a towel to absorb water out of the ear canal after swimming or bathing.  Avoid scratching or  putting objects inside your ear. This can damage the ear canal or remove the protective wax that lines the canal. This makes it easier for bacteria and fungi to grow.  Avoid swimming in lakes, polluted water, or poorly chlorinated pools.  You may use ear drops made of rubbing alcohol and vinegar after swimming. Combine equal parts of white vinegar and alcohol in a bottle. Put 3 or 4 drops into each ear after swimming. SEEK MEDICAL CARE IF:   You have a fever.  Your ear is still red, swollen, painful, or draining pus after 3 days.  Your redness, swelling, or pain gets worse.  You have a severe headache.  You have redness, swelling, pain, or tenderness in the area behind your ear. MAKE SURE YOU:   Understand these instructions.  Will watch your condition.  Will get help right away if you are not doing well or get worse. Document Released: 08/20/2005 Document Revised: 01/04/2014 Document Reviewed: 09/06/2011 [facility] Patient Information 2015 [employer] [place]. This information is not intended to replace advice given to you by your health care provider. Make sure you discuss any questions you have with your health care provider.

## 2015-03-18 ENCOUNTER — Ambulatory Visit: Payer: Medicaid Other | Admitting: Obstetrics & Gynecology

## 2015-03-30 ENCOUNTER — Ambulatory Visit: Payer: Medicaid Other | Admitting: Obstetrics & Gynecology

## 2015-04-22 NOTE — Progress Notes (Signed)
Pt's FMLA paperwork filled out and faxed to [employer] @ [phone] as patient requested.  Copy placed in filler for pt to pick up.

## 2016-02-23 ENCOUNTER — Emergency Department (HOSPITAL_COMMUNITY)
Admission: EM | Admit: 2016-02-23 | Discharge: 2016-02-23 | Disposition: A | Discharge status: Home / Self Care | Payer: Medicaid Other | Attending: Emergency Medicine | Admitting: Emergency Medicine

## 2016-02-23 DIAGNOSIS — J45909 Unspecified asthma, uncomplicated: Secondary | ICD-10-CM | POA: Insufficient documentation

## 2016-02-23 DIAGNOSIS — M546 Pain in thoracic spine: Secondary | ICD-10-CM | POA: Insufficient documentation

## 2016-02-23 DIAGNOSIS — R072 Precordial pain: Secondary | ICD-10-CM | POA: Insufficient documentation

## 2016-02-23 DIAGNOSIS — Y939 Activity, unspecified: Secondary | ICD-10-CM | POA: Insufficient documentation

## 2016-02-23 DIAGNOSIS — Z87891 Personal history of nicotine dependence: Secondary | ICD-10-CM | POA: Insufficient documentation

## 2016-02-23 DIAGNOSIS — Y999 Unspecified external cause status: Secondary | ICD-10-CM | POA: Insufficient documentation

## 2016-02-23 DIAGNOSIS — X500XXA Overexertion from strenuous movement or load, initial encounter: Secondary | ICD-10-CM | POA: Insufficient documentation

## 2016-02-23 DIAGNOSIS — Y929 Unspecified place or not applicable: Secondary | ICD-10-CM | POA: Insufficient documentation

## 2016-02-23 MED ORDER — NAPROXEN 500 MG PO TABS: 500.0000 mg | ORAL_TABLET | Freq: Two times a day (BID) | ORAL | Status: DC

## 2016-02-23 MED ORDER — METHOCARBAMOL 500 MG PO TABS
1000.0000 mg | ORAL_TABLET | Freq: Once | ORAL | Status: AC
  Administered 2016-02-23: 1000 mg via ORAL
  Filled 2016-02-23: qty 2

## 2016-02-23 MED ORDER — OXYCODONE-ACETAMINOPHEN 5-325 MG PO TABS: 1.0000 | ORAL_TABLET | ORAL | Status: DC | PRN

## 2016-02-23 MED ORDER — METHOCARBAMOL 500 MG PO TABS: 500.0000 mg | ORAL_TABLET | Freq: Two times a day (BID) | ORAL | Status: DC

## 2016-02-23 MED ORDER — LIDOCAINE 5 % EX PTCH
1.0000 | MEDICATED_PATCH | CUTANEOUS | Status: DC
  Administered 2016-02-23: 1 via TRANSDERMAL
  Filled 2016-02-23: qty 1

## 2016-02-23 MED ORDER — KETOROLAC TROMETHAMINE 60 MG/2ML IM SOLN
60.0000 mg | Freq: Once | INTRAMUSCULAR | Status: AC
  Administered 2016-02-23: 60 mg via INTRAMUSCULAR
  Filled 2016-02-23: qty 2

## 2016-02-23 MED ORDER — LIDOCAINE 5 % EX PTCH: 1.0000 | MEDICATED_PATCH | CUTANEOUS | Status: DC

## 2016-02-23 NOTE — ED Notes (Signed)
Picked up daughter in her car seat this am and thinks she strained her back has not taken meds middle to left back hurts throbbing pain

## 2016-02-23 NOTE — Discharge Instructions (Signed)
You have been seen today for back pain. Your presentation is consistent with a muscle strain. Expect your soreness to increase over the next 2-3 days. Take it easy, but do not lay around too much as this may make the stiffness worse. Take 500 mg of naproxen every 12 hours or 800 mg of ibuprofen every 8 hours for the next 3 days. Take these medications with food to avoid upset stomach. Robaxin is a muscle relaxer and may help loosen stiff muscles. Percocet for severe pain. Do not take the Robaxin or Percocet while driving or performing other dangerous activities. Follow up with PCP as needed should symptoms continue. Return to ED should symptoms worsen.

## 2016-02-23 NOTE — ED Provider Notes (Signed)
CSN: [id number]     Arrival date & time 02/23/16  1345 History  By signing my name below, I, Placido SouLogan Joldersma, attest that this documentation has been prepared under the direction and in the presence of Shawn C. Joy, PA-C. Electronically Signed: Placido SouLogan Joldersma, ED Scribe. 02/23/2016. 4:24 PM.   No chief complaint on file.  The history is provided by the patient. No language interpreter was used.    HPI Comments: Sherry Ferguson is a 30 y.o. female who presents to the Emergency Department complaining of constant, moderate, left central back pain x 10 hours. Pt states that she was pulling her young daughter out of the car and felt a sudden onset of her pain. Her pain worsens with movement or palpation. She denies a hx of back pain or injuries in the affected region. She additionally complains of intermittent, mild, central chest soreness onset following her injury this morning. Pt describes her pain as throbbing soreness. She  states her central chest "feels sore" when palpating the region. She denies bowel or bladder abnormalities, neuro deficits, shortness of breath, or any other complaints.     Past Medical History  Diagnosis Date  . Allergy   . Asthma   . IUD (intrauterine device) in place     no longer has IUD- removed in 2015  . Bronchitis, acute    Past Surgical History  Procedure Laterality Date  . No past surgeries     Family History  Problem Relation Age of Onset  . Asthma Mother   . Diabetes Father   . Hyperlipidemia Father   . Hypertension Father   . Bronchitis Father    Social History  Substance Use Topics  . Smoking status: Former Smoker    Types: Cigarettes    Quit date: 09/04/2007  . Smokeless tobacco: Never Used  . Alcohol Use: No   OB History    Gravida Para Term Preterm AB TAB SAB Ectopic Multiple Living   3 3 3       0 3     Review of Systems  Gastrointestinal: Positive for nausea. Negative for vomiting.  Musculoskeletal: Positive for myalgias (chest  wall soreness and left upper back pain) and back pain.  Skin: Negative for color change and wound.    Allergies  Review of patient's allergies indicates no known allergies.  Home Medications   Prior to Admission medications   Medication Sig Start Date End Date Taking? Authorizing Provider  acetaminophen (TYLENOL) 500 MG tablet Take 1,000 mg by mouth every 6 (six) hours as needed.    Historical Provider, MD  budesonide-formoterol (SYMBICORT) 160-4.5 MCG/ACT inhaler Inhale 2 puffs into the lungs 2 (two) times daily. 12/24/14   Allie BossierMyra C Dove, MD  lidocaine (LIDODERM) 5 % Place 1 patch onto the skin daily. Remove & Discard patch within 12 hours or as directed by MD 02/23/16   Anselm PancoastShawn C Joy, PA-C  methocarbamol (ROBAXIN) 500 MG tablet Take 1 tablet (500 mg total) by mouth 2 (two) times daily. 02/23/16   Shawn C Joy, PA-C  naproxen (NAPROSYN) 500 MG tablet Take 1 tablet (500 mg total) by mouth 2 (two) times daily. 02/23/16   Shawn C Joy, PA-C  neomycin-polymyxin-hydrocortisone (CORTISPORIN) otic solution Place 4 drops into the right ear 4 (four) times daily. 03/17/15   Joselyn ArrowEve Knapp, MD  oxyCODONE-acetaminophen (PERCOCET/ROXICET) 5-325 MG tablet Take 1 tablet by mouth every 4 (four) hours as needed for severe pain. 02/23/16   Shawn C Joy, PA-C   BP  113/78 mmHg  Pulse 77  Temp(Src) 98.6 F (37 C) (Oral)  Resp 18  SpO2 100%    Physical Exam  Constitutional: She appears well-developed and well-nourished. No distress.  HENT:  Head: Normocephalic and atraumatic.  Eyes: Conjunctivae are normal.  Neck: Neck supple.  Cardiovascular: Normal rate, regular rhythm, normal heart sounds and intact distal pulses.   Pulmonary/Chest: Effort normal and breath sounds normal. No respiratory distress. She exhibits tenderness.  Abdominal: Soft. There is no tenderness. There is no guarding.  Musculoskeletal: Normal range of motion. She exhibits tenderness. She exhibits no edema.  Tenderness to the musculature overlying  left scapula. Full ROM in all extremities and spine. No paraspinal tenderness. Tenderness to the chest wall at the sternum. No instability or deformity.  Neurological: She is alert.  No sensory deficits. Strength 5/5 in all extremities. No gait disturbance. Coordination intact.   Skin: Skin is warm and dry. She is not diaphoretic.  Psychiatric: She has a normal mood and affect. Her behavior is normal.  Nursing note and vitals reviewed.   ED Course  Procedures  DIAGNOSTIC STUDIES: Oxygen Saturation is 99% on RA, normal by my interpretation.    COORDINATION OF CARE: 4:18 PM Pt presents with back pain after lifting her child. Discussed next steps with pt. Strict return precautions noted. Pt verbalized understanding and is agreeable with the plan.     MDM   Final diagnoses:  Left-sided thoracic back pain    Sherry Ferguson presents with left upper back pain following a lifting incident this morning.  Suspect muscle strain for both of the patient's complaints. The pain is reproducible on palpation and with movement. Patient has no red flag symptoms. Patient is nontoxic appearing and has normal vital signs. Very low suspicion for thoracic aortic dissection. Home care instructions strict return precautions discussed. Patient voiced understanding of these instructions and is comfortable with discharge.  I personally performed the services described in this documentation, which was scribed in my presence. The recorded information has been reviewed and is accurate.   Anselm PancoastShawn C Joy, PA-C 02/23/16 1714  Azalia BilisKevin Campos, MD 02/24/16 0111

## 2016-05-23 ENCOUNTER — Ambulatory Visit: Payer: Medicaid Other | Admitting: *Deleted

## 2016-05-23 DIAGNOSIS — R300 Dysuria: Secondary | ICD-10-CM

## 2016-05-23 DIAGNOSIS — R350 Frequency of micturition: Secondary | ICD-10-CM

## 2016-05-23 LAB — POCT URINALYSIS DIP (DEVICE)
Bilirubin Urine: NEGATIVE
Glucose, UA: NEGATIVE mg/dL
KETONES UR: NEGATIVE mg/dL
LEUKOCYTES UA: NEGATIVE
Nitrite: NEGATIVE
Protein, ur: 30 mg/dL — AB
SPECIFIC GRAVITY, URINE: 1.025 (ref 1.005–1.030)
Urobilinogen, UA: 1 mg/dL (ref 0.0–1.0)
pH: 6.5 (ref 5.0–8.0)

## 2016-05-23 NOTE — Progress Notes (Signed)
Pt presented for urine testing due to dysuria and frequency of urination x2 days.  CCUA did not indicate UTI however due to pt c/o will send specimen for culture anyway. Pt was advised to increase po fluid intake and may take OTC AZO for bladder spasms. Of note, pt is taking doxycycline for sinus infection.

## 2016-05-24 LAB — URINE CULTURE: ORGANISM ID, BACTERIA: NO GROWTH

## 2016-05-28 ENCOUNTER — Telehealth: Payer: Self-pay | Admitting: *Deleted

## 2016-05-28 NOTE — Telephone Encounter (Signed)
Called pt and left message on her personal voice mail stating that her urine culture from last week was negative for bladder infection.

## 2016-05-29 ENCOUNTER — Encounter: Payer: Self-pay | Admitting: Gastroenterology

## 2016-05-29 ENCOUNTER — Ambulatory Visit (INDEPENDENT_AMBULATORY_CARE_PROVIDER_SITE_OTHER): Payer: Commercial Managed Care - PPO | Admitting: Medical

## 2016-05-29 ENCOUNTER — Encounter: Payer: Self-pay | Admitting: Medical

## 2016-05-29 VITALS — BP 106/80 | HR 83 | Temp 98.3°F | Ht 65.0 in | Wt 276.0 lb

## 2016-05-29 DIAGNOSIS — Z8371 Family history of colonic polyps: Secondary | ICD-10-CM

## 2016-05-29 DIAGNOSIS — Z800 Family history of malignant neoplasm of digestive organs: Secondary | ICD-10-CM | POA: Diagnosis not present

## 2016-05-29 DIAGNOSIS — J4541 Moderate persistent asthma with (acute) exacerbation: Secondary | ICD-10-CM

## 2016-05-29 DIAGNOSIS — J309 Allergic rhinitis, unspecified: Secondary | ICD-10-CM | POA: Diagnosis not present

## 2016-05-29 DIAGNOSIS — B359 Dermatophytosis, unspecified: Secondary | ICD-10-CM | POA: Insufficient documentation

## 2016-05-29 DIAGNOSIS — J4540 Moderate persistent asthma, uncomplicated: Secondary | ICD-10-CM | POA: Insufficient documentation

## 2016-05-29 MED ORDER — MONTELUKAST SODIUM 10 MG PO TABS: 10.0000 mg | ORAL_TABLET | Freq: Every day | ORAL | 3 refills | Status: DC

## 2016-05-29 MED ORDER — LEVALBUTEROL HCL 1.25 MG/3ML IN NEBU: 1.2500 mg | INHALATION_SOLUTION | RESPIRATORY_TRACT | 2 refills | Status: DC | PRN

## 2016-05-29 MED ORDER — LEVALBUTEROL TARTRATE 45 MCG/ACT IN AERO: 2.0000 | INHALATION_SPRAY | Freq: Four times a day (QID) | RESPIRATORY_TRACT | 2 refills | Status: DC | PRN

## 2016-05-29 MED ORDER — BUDESONIDE-FORMOTEROL FUMARATE 160-4.5 MCG/ACT IN AERO: 2.0000 | INHALATION_SPRAY | Freq: Two times a day (BID) | RESPIRATORY_TRACT | 3 refills | Status: DC

## 2016-05-29 MED ORDER — TERBINAFINE HCL 1 % EX CREA: 1.0000 "application " | TOPICAL_CREAM | Freq: Two times a day (BID) | CUTANEOUS | 0 refills | Status: DC

## 2016-05-29 NOTE — Progress Notes (Signed)
Subjective: Chief Complaint  Patient presents with  . Medication Management  . Rash    R arm   Dad had sample of Symbicort, so been using this and old inhalers she had.  No fever, no chest pain.  If active gets SOB.    She had 3 dogs, got rid of one dog that was her nieces'.   She notes prior allergy testing revealing allergies to trees, grass , mold , pollen.    Wants refills on medications now that she has insurance. Symbicort has always worked well. She thinks she has been on Singulair prior.   However, albuterol rescue seems to worsen things at times  She notes rash of right arm, possibly ringworm.   One of her children has had ringworm  Been having some knee pains, has prior hx/o knee pains.   Went to the zoo the last 2 weekends, has left knee pain over knee cap.    Wants colonoscopy.    Has several family members with numerous polyps, at least 2 family members with colon cancer.  Denies blood in stool, no bowel issues, no prior colonoscopy.     Past Medical History:  Diagnosis Date  . Allergy   . Asthma   . Bronchitis, acute   . IUD (intrauterine device) in place    no longer has IUD- removed in 2015   Current Outpatient Prescriptions on File Prior to Visit  Medication Sig Dispense Refill  . acetaminophen (TYLENOL) 500 MG tablet Take 1,000 mg by mouth every 6 (six) hours as needed.    . budesonide-formoterol (SYMBICORT) 160-4.5 MCG/ACT inhaler Inhale 2 puffs into the lungs 2 (two) times daily. 1 Inhaler 12  . methocarbamol (ROBAXIN) 500 MG tablet Take 1 tablet (500 mg total) by mouth 2 (two) times daily. (Patient not taking: Reported on 05/29/2016) 20 tablet 0   No current facility-administered medications on file prior to visit.    Family History  Problem Relation Age of Onset  . Asthma Mother   . Colon polyps Mother   . Diabetes Father   . Hyperlipidemia Father   . Hypertension Father   . Bronchitis Father   . Colon polyps Maternal Aunt   . Cancer Maternal Uncle 46     colon, stage 4 at diagnosis  . Cancer Maternal Grandmother     colon  . Colon polyps Maternal Aunt   . Colon polyps Maternal Uncle     ROS as in subjective   Objective: BP 106/80 (BP Location: Right Arm, Patient Position: Sitting, Cuff Size: Large)   Pulse 83   Temp 98.3 F (36.8 C) (Oral)   Ht 5\' 5"  (1.651 m)   Wt 276 lb (125.2 kg)   SpO2 98%   BMI 45.93 kg/m   General appearance: alert, no distress, WD/WN, obese AA female HEENT: normocephalic, sclerae anicteric, TMs pearly, nares with boggy swollen pink turbinates and clear discharge , pharynx normal Oral cavity: MMM, no lesions Neck: supple, no lymphadenopathy, no thyromegaly, no masses Heart: RRR, normal S1, S2, no murmurs Lungs: CTA bilaterally, +scattered expiratory wheezes, rhonchi, or rales Pulses: 2+ symmetric, upper and lower extremities, normal cap refill Ext: no edema Legs neurovascularly intact MSK: tender over left patella, and there is some valgus pre sensation of bilat knees due to obesity/body habitus.  otherwise left knee without swelling or deformity, no redness or bruising, no other abnormality or laxity Skin: small patch of rough skin right distal posterior forearm, 4mm diameter, likely early ringworm  Assessment: Encounter Diagnoses  Name Primary?  Marland Kitchen. Asthma, moderate persistent, with acute exacerbation Yes  . Ringworm   . Family history of colon cancer   . Family history of familial polyposis   . Allergic rhinitis, unspecified allergic rhinitis type     Plan: Asthma and allergies - begin back on Symbicort, trial of Xopenex since not tolerating albuterol.   Avoid triggers.    Begin Singulair to help prevent flares ups and control allergies  PFT reviewed today.  Ringworm - begin Lamisil cream x 2-3 wk until gone  Family hx/o colon polyps - referral to GI for consult and likely colonoscopy  Knee pain - patellofemoral syndrome. Discussed home stretching and strengthening exercises to use. If  not improving, consider PT  Joni Reiningicole was seen today for medication management and rash.  Diagnoses and all orders for this visit:  Asthma, moderate persistent, with acute exacerbation -     budesonide-formoterol (SYMBICORT) 160-4.5 MCG/ACT inhaler; Inhale 2 puffs into the lungs 2 (two) times daily. -     levalbuterol (XOPENEX HFA) 45 MCG/ACT inhaler; Inhale 2 puffs into the lungs every 6 (six) hours as needed for wheezing. -     levalbuterol (XOPENEX) 1.25 MG/3ML nebulizer solution; Take 1.25 mg by nebulization every 4 (four) hours as needed for wheezing. -     montelukast (SINGULAIR) 10 MG tablet; Take 1 tablet (10 mg total) by mouth at bedtime. -     Spirometry with graph  Ringworm -     terbinafine (LAMISIL AT) 1 % cream; Apply 1 application topically 2 (two) times daily.  Family history of colon cancer -     Ambulatory referral to Gastroenterology  Family history of familial polyposis -     Ambulatory referral to Gastroenterology  Allergic rhinitis, unspecified allergic rhinitis type -     montelukast (SINGULAIR) 10 MG tablet; Take 1 tablet (10 mg total) by mouth at bedtime.

## 2016-06-26 NOTE — Progress Notes (Signed)
Terrilee Files.  Zach Alichia Alridge D.Corinda Gubler. [facility] [address] [place], [place] [address] Phone: [phone] Subjective:    I'm seeing this patient by the request  of:  Carollee HerterLALONDE,JOHN CHARLES, MD   CC: Knee problems  BJY:[id number]:Subjective  Sherry Ferguson is a 30 y.o. female coming in with complaint of knee pain  Patient states that it seems to be bilateral. Seems to be on the anterior aspect of the knees. Worse with going up or downstairs. Has had it for many months if not years but seems to be worsening. Patient like to start being more active and tried to work out on a regular basis but feels that her knee stop her from doing this. Denies any nighttime pain. Rates the severity of pain though is 6 out of 10. When response minorly to anti-inflammatories.     Past Medical History:  Diagnosis Date  . Allergy   . Asthma   . Bronchitis, acute   . IUD (intrauterine device) in place    no longer has IUD- removed in 2015   Past Surgical History:  Procedure Laterality Date  . NO PAST SURGERIES     Social History   Social History  . Marital status: Married    Spouse name: N/A  . Number of children: N/A  . Years of education: N/A   Social History Main Topics  . Smoking status: Former Smoker    Types: Cigarettes    Quit date: 09/04/2007  . Smokeless tobacco: Never Used  . Alcohol use No  . Drug use: No  . Sexual activity: Yes    Birth control/ protection: None   Other Topics Concern  . None   Social History Narrative   Married.  Works part-time as a Conservation officer, naturecashier at [employer].  She has 3 daughters and a stepdaughter   No Known Allergies Family History  Problem Relation Age of Onset  . Asthma Mother   . Colon polyps Mother   . Diabetes Father   . Hyperlipidemia Father   . Hypertension Father   . Bronchitis Father   . Colon polyps Maternal Aunt   . Cancer Maternal Uncle 46    colon, stage 4 at diagnosis  . Cancer Maternal Grandmother     colon  . Colon polyps Maternal Aunt   .  Colon polyps Maternal Uncle     Past medical history, social, surgical and family history all reviewed in electronic medical record.  No pertanent information unless stated regarding to the chief complaint.   Review of Systems: No headache, visual changes, nausea, vomiting, diarrhea, constipation, dizziness, abdominal pain, skin rash, fevers, chills, night sweats, weight loss, swollen lymph nodes, body aches, joint swelling, muscle aches, chest pain, shortness of breath, mood changes.   Objective  Blood pressure 114/80, pulse 84, weight 271 lb (122.9 kg), SpO2 99 %, currently breastfeeding.  General: No apparent distress alert and oriented x3 mood and affect normal, dressed appropriately.  HEENT: Pupils equal, extraocular movements intact  Respiratory: Patient's speak in full sentences and does not appear short of breath  Cardiovascular: No lower extremity edema, non tender, no erythema  Skin: Warm dry intact with no signs of infection or rash on extremities or on axial skeleton.  Abdomen: Soft nontender  Neuro: Cranial nerves II through XII are intact, neurovascularly intact in all extremities with 2+ DTRs and 2+ pulses.  Lymph: No lymphadenopathy of posterior or anterior cervical chain or axillae bilaterally.  Gait normal with good balance and coordination.  MSK:  Non tender with full range of motion and good stability and symmetric strength and tone of shoulders, elbows, wrist, hip, and ankles bilaterally.  Knee: Bilateral Lateral tilt of the patellofemoral joint bilaterally right greater than left. Tenderness over the patellofemoral joint ROM full in flexion and extension and lower leg rotation. Ligaments with solid consistent endpoints including ACL, PCL, LCL, MCL. Negative Mcmurray's, Apley's, and Thessalonian tests. Positive painful patellar compression Patellar glide with moderate crepitus. Patellar and quadriceps tendons unremarkable. Hamstring and quadriceps strength is normal.      MSK US performed of: Right This study was ordered, performed, and interpreted by Terrilee Files D.O.  Knee: Moderate narrowing of the patellofemoral as well as the medial joint space. Hypoechoic changes somewhat underneath the patella itself that is consistent with patellofemoral syndrome. No loose bodies patient today.Marland Kitchen  IMPRESSION:  Mild to moderate arthritic changes of the patellofemoral and medial joint space  Procedure note 97110; 15 minutes spent for Therapeutic exercises as stated in above notes.  This included exercises focusing on stretching, strengthening, with significant focus on eccentric aspects.   Flexion and extension as well as VMO and hip abductor strengthening. Proper technique shown and discussed handout in great detail with ATC.  All questions were discussed and answered.      Impression and Recommendations:     This case required medical decision making of moderate complexity.      Note: This dictation was prepared with Dragon dictation along with smaller phrase technology. Any transcriptional errors that result from this process are unintentional.

## 2016-06-27 ENCOUNTER — Encounter: Payer: Self-pay | Admitting: Family Medicine

## 2016-06-27 ENCOUNTER — Ambulatory Visit (INDEPENDENT_AMBULATORY_CARE_PROVIDER_SITE_OTHER): Payer: Commercial Managed Care - PPO | Admitting: Family Medicine

## 2016-06-27 DIAGNOSIS — M222X2 Patellofemoral disorders, left knee: Secondary | ICD-10-CM

## 2016-06-27 DIAGNOSIS — M1711 Unilateral primary osteoarthritis, right knee: Secondary | ICD-10-CM | POA: Diagnosis not present

## 2016-06-27 DIAGNOSIS — E669 Obesity, unspecified: Secondary | ICD-10-CM | POA: Diagnosis not present

## 2016-06-27 DIAGNOSIS — M222X1 Patellofemoral disorders, right knee: Secondary | ICD-10-CM | POA: Diagnosis not present

## 2016-06-27 MED ORDER — MELOXICAM 15 MG PO TABS: 15.0000 mg | ORAL_TABLET | Freq: Every day | ORAL | 0 refills | Status: DC

## 2016-06-27 NOTE — Assessment & Plan Note (Signed)
Patellofemoral syndrome bilaterally. We discussed with patient at great length. Given oral anti-inflammatories, icing protocol, home exercises. Patient work with Event organiserathletic trainer. We discussed the importance of weight loss as well as proper shoes. Patient will come back and see me again in 4 weeks. If worsening symptoms we'll consider injection in formal physical therapy.

## 2016-06-27 NOTE — Patient Instructions (Addendum)
Good to see you  Ice 20 minutes 2 times daily. Usually after activity and before bed. Exercises 3 times a week.  meloxicam daily for 10 days then as needed.  S[employer]co orthotics "total support" online would be great  Over the counter Vitamin D 2000 IU daily  Turmeric 500mg  twice daily  See me again in 4 weeks.

## 2016-06-27 NOTE — Assessment & Plan Note (Signed)
Encourage weight loss. 

## 2016-07-19 ENCOUNTER — Encounter: Payer: Self-pay | Admitting: Family

## 2016-07-19 ENCOUNTER — Ambulatory Visit (INDEPENDENT_AMBULATORY_CARE_PROVIDER_SITE_OTHER): Payer: Commercial Managed Care - PPO | Admitting: Family

## 2016-07-19 VITALS — BP 110/68 | HR 99 | Temp 98.8°F | Resp 16 | Ht 65.0 in | Wt 269.0 lb

## 2016-07-19 DIAGNOSIS — B359 Dermatophytosis, unspecified: Secondary | ICD-10-CM | POA: Diagnosis not present

## 2016-07-19 DIAGNOSIS — J45909 Unspecified asthma, uncomplicated: Secondary | ICD-10-CM | POA: Insufficient documentation

## 2016-07-19 DIAGNOSIS — J4520 Mild intermittent asthma, uncomplicated: Secondary | ICD-10-CM

## 2016-07-19 MED ORDER — BUDESONIDE-FORMOTEROL FUMARATE 160-4.5 MCG/ACT IN AERO: 2.0000 | INHALATION_SPRAY | Freq: Two times a day (BID) | RESPIRATORY_TRACT | 3 refills | Status: DC

## 2016-07-19 MED ORDER — FLUCONAZOLE 200 MG PO TABS: 200.0000 mg | ORAL_TABLET | ORAL | 1 refills | Status: DC

## 2016-07-19 NOTE — Assessment & Plan Note (Signed)
Asthma appears adequately controlled with no exacerbations and maintained well on the current treatments with no night time awakenings and symptoms occurring less than weekly. Continue current dosage of Symbicort. Continue to monitor.

## 2016-07-19 NOTE — Patient Instructions (Addendum)
Thank you for choosing [facility].  SUMMARY AND INSTRUCTIONS:  Medication:  Start the fluconazole weekly for 2 weeks and then repeat if needed.  Continue to use the Symbicort as prescribed.   Your prescription(s) have been submitted to your pharmacy or been printed and provided for you. Please take as directed and contact our office if you believe you are having problem(s) with the medication(s) or have any questions.  Follow up:  If your symptoms worsen or fail to improve, please contact our office for further instruction, or in case of emergency go directly to the emergency room at the closest medical facility.    Body Ringworm Introduction Body ringworm is an infection of the skin that often causes a ring-shaped rash. Body ringworm can affect any part of your skin. It can spread easily to others. Body ringworm is also called tinea corporis. What are the causes? This condition is caused by funguses called dermatophytes. The condition develops when these funguses grow out of control on the skin. You can get this condition if you touch a person or animal that has it. You can also get it if you share clothing, bedding, towels, or any other object with an infected person or pet. What increases the risk? This condition is more likely to develop in:  Athletes who often make skin-to-skin contact with other athletes, such as wrestlers.  People who share equipment and mats.  People with a weakened immune system. What are the signs or symptoms? Symptoms of this condition include:  Itchy, raised red spots and bumps.  Red scaly patches.  A ring-shaped rash. The rash may have:  A clear center.  Scales or red bumps at its center.  Redness near its borders.  Dry and scaly skin on or around it. How is this diagnosed? This condition can usually be diagnosed with a skin exam. A skin scraping may be taken from the affected area and examined under a microscope to see if the  fungus is present. How is this treated? This condition may be treated with:  An antifungal cream or ointment.  An antifungal shampoo.  Antifungal medicines. These may be prescribed if your ringworm is severe, keeps coming back, or lasts a long time. Follow these instructions at home:  Take over-the-counter and prescription medicines only as told by your health care provider.  If you were given an antifungal cream or ointment:  Use it as told by your health care provider.  Wash the infected area and dry it completely before applying the cream or ointment.  If you were given an antifungal shampoo:  Use it as told by your health care provider.  Leave the shampoo on your body for 3-5 minutes before rinsing.  While you have a rash:  Wear loose clothing to stop clothes from rubbing and irritating it.  Wash or change your bed sheets every night.  If your pet has the same infection, take your pet to see a International aid/development workerveterinarian. How is this prevented?  Practice good hygiene.  Wear sandals or shoes in public places and showers.  Do not share personal items with others.  Avoid touching red patches of skin on other people.  Avoid touching pets that have bald spots.  If you touch an animal that has a bald spot, wash your hands. Contact a health care provider if:  Your rash continues to spread after 7 days of treatment.  Your rash is not gone in 4 weeks.  The area around your rash gets red,  warm, tender, and swollen. This information is not intended to replace advice given to you by your health care provider. Make sure you discuss any questions you have with your health care provider. Document Released: 08/17/2000 Document Revised: 01/26/2016 Document Reviewed: 06/16/2015  2017 Elsevier

## 2016-07-19 NOTE — Assessment & Plan Note (Signed)
Symptoms and exam consistent with ringworm resistant to OTC medications. Start fluconazole. Follow up if symptoms worsen or do not improve.

## 2016-07-19 NOTE — Progress Notes (Signed)
Subjective:    Patient ID: Sherry NovemberNicole A Tarkington, female    DOB: 1986-04-20, 30 y.o.   MRN: [id number]  Chief Complaint  Patient presents with  . Establish Care    Ring worm on right arm, wanting to see about getting something oral to take for it    HPI:  Sherry Ferguson is a 30 y.o. female who  has a past medical history of Allergy; Asthma; Bronchitis, acute; and IUD (intrauterine device) in place. and presents today for an office visit to establish care   1.) Asthma - Currently maintained on Symbicort and albuterol. Reports taking the medication as prescribed and denies adverse side effects. Has symptoms less than weekly and no night time awakenings with only an occasional exacerbation with her allergies.   2.) Tinea Corporis - This is a new problem. Associated symptom of a rash located on her posterior right forearm that has been going on for about 1 month. Described as discolored with no change in size or spreading. Modifying factors include OTC medications which has not helped very much.    No Known Allergies   Outpatient Medications Prior to Visit  Medication Sig Dispense Refill  . meloxicam (MOBIC) 15 MG tablet Take 1 tablet (15 mg total) by mouth daily. 30 tablet 0  . methocarbamol (ROBAXIN) 500 MG tablet Take 1 tablet (500 mg total) by mouth 2 (two) times daily. 20 tablet 0  . budesonide-formoterol (SYMBICORT) 160-4.5 MCG/ACT inhaler Inhale 2 puffs into the lungs 2 (two) times daily. 3 Inhaler 3   No facility-administered medications prior to visit.      Past Medical History:  Diagnosis Date  . Allergy   . Asthma   . Bronchitis, acute   . IUD (intrauterine device) in place    no longer has IUD- removed in 2015      Past Surgical History:  Procedure Laterality Date  . NO PAST SURGERIES        Family History  Problem Relation Age of Onset  . Asthma Mother   . Colon polyps Mother   . Diabetes Father   . Hyperlipidemia Father   . Hypertension Father   .  Bronchitis Father   . Colon polyps Maternal Aunt   . Cancer Maternal Uncle 46    colon, stage 4 at diagnosis  . Cancer Maternal Grandmother     colon  . Colon polyps Maternal Aunt   . Colon polyps Maternal Uncle       Social History   Social History  . Marital status: Married    Spouse name: N/A  . Number of children: 3  . Years of education: 14   Occupational History  . Not on file.   Social History Main Topics  . Smoking status: Former Smoker    Packs/day: 0.15    Years: 2.00    Types: Cigarettes    Quit date: 09/04/2007  . Smokeless tobacco: Never Used  . Alcohol use 0.0 oz/week     Comment: Occasionally  . Drug use: No  . Sexual activity: Yes    Birth control/ protection: None   Other Topics Concern  . Not on file   Social History Narrative   Married.  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     Review of Systems  Constitutional: Negative for chills and fever.  Respiratory: Negative for chest tightness, shortness of breath  and wheezing.   Cardiovascular: Negative for chest pain, palpitations and leg swelling.  Skin: Positive for rash.      Objective:    BP 110/68 (BP Location: Left Arm, Patient Position: Sitting, Cuff Size: Large)   Pulse 99   Temp 98.8 F (37.1 C) (Oral)   Resp 16   Ht 5\' 5"  (1.651 m)   Wt 269 lb (122 kg)   SpO2 98%   BMI 44.76 kg/m  Nursing note and vital signs reviewed.  Physical Exam  Constitutional: She is oriented to person, place, and time. She appears well-developed and well-nourished. No distress.  Cardiovascular: Normal rate, regular rhythm, normal heart sounds and intact distal pulses.   Pulmonary/Chest: Effort normal and breath sounds normal. She has no wheezes. She has no rales. She exhibits no tenderness.  Neurological: She is alert and oriented to person, place, and time.  Skin: Skin is warm and dry.  Right arm - Posterior right  are with oval shaped pinkish discoloration with clearly defined borders and no evidence of infection or discharge.   Psychiatric: She has a normal mood and affect. Her behavior is normal. Judgment and thought content normal.        Assessment & Plan:   Problem List Items Addressed This Visit      Respiratory   Asthma - Primary    Asthma appears adequately controlled with no exacerbations and maintained well on the current treatments with no night time awakenings and symptoms occurring less than weekly. Continue current dosage of Symbicort. Continue to monitor.       Relevant Medications   budesonide-formoterol (SYMBICORT) 160-4.5 MCG/ACT inhaler     Other   Ringworm    Symptoms and exam consistent with ringworm resistant to OTC medications. Start fluconazole. Follow up if symptoms worsen or do not improve.       Relevant Medications   fluconazole (DIFLUCAN) 200 MG tablet       I am having Sherry Ferguson start on fluconazole. I am also having her maintain her methocarbamol, meloxicam, and budesonide-formoterol.   Meds ordered this encounter  Medications  . budesonide-formoterol (SYMBICORT) 160-4.5 MCG/ACT inhaler    Sig: Inhale 2 puffs into the lungs 2 (two) times daily.    Dispense:  3 Inhaler    Refill:  3    Order Specific Question:   Supervising Provider    Answer:   Hillard DankerRAWFORD, ELIZABETH A [[id number]]  . fluconazole (DIFLUCAN) 200 MG tablet    Sig: Take 1 tablet (200 mg total) by mouth once a week.    Dispense:  2 tablet    Refill:  1    Order Specific Question:   Supervising Provider    Answer:   Hillard DankerCRAWFORD, ELIZABETH A [[id number]]     Follow-up: Return in about 3 months (around 10/19/2016), or if symptoms worsen or fail to improve.  Jeanine Luzalone, Maveryck Bahri, FNP

## 2016-07-25 ENCOUNTER — Ambulatory Visit: Payer: Commercial Managed Care - PPO | Admitting: Family Medicine

## 2016-08-03 ENCOUNTER — Encounter: Payer: Self-pay | Admitting: Gastroenterology

## 2016-08-03 ENCOUNTER — Ambulatory Visit (INDEPENDENT_AMBULATORY_CARE_PROVIDER_SITE_OTHER): Payer: Commercial Managed Care - PPO | Admitting: Gastroenterology

## 2016-08-03 VITALS — BP 100/74 | HR 96 | Ht 65.0 in | Wt 273.2 lb

## 2016-08-03 DIAGNOSIS — Z8371 Family history of colonic polyps: Secondary | ICD-10-CM | POA: Diagnosis not present

## 2016-08-03 NOTE — Progress Notes (Signed)
[facility] Consult Note:  History: Scherrie Novembericole A Denison 08/03/2016  Referring physician: Jeanine Luzalone, Gregory, FNP  Reason for consult/chief complaint: Family history of colon polyps (pt states no complaints; colon cancer in grandmother)   Subjective  HPI:  The patient came here inquiring about rectal cancer screening. She denies abdominal pain change in bowel habits or rectal bleeding. Her grandmother was diagnosed with colon cancer, and a maternal uncle had colon cancer in his 30s. Sherry Ferguson has 1 sibling who has not had a colonoscopy, and Rielyn's mother has had "2 or 3 small polyps".   ROS:  Review of Systems She denies chest pain or dyspnea  Past Medical History: Past Medical History:  Diagnosis Date  . Allergy   . Asthma   . Bronchitis, acute   . IUD (intrauterine device) in place    no longer has IUD- removed in 2015     Past Surgical History: Past Surgical History:  Procedure Laterality Date  . NO PAST SURGERIES       Family History: Family History  Problem Relation Age of Onset  . Asthma Mother   . Colon polyps Mother   . Diabetes Father   . Hyperlipidemia Father   . Hypertension Father   . Bronchitis Father   . Heart attack Father   . Colon polyps Maternal Aunt   . Colon cancer Maternal Uncle 46    stage 4 at diagnosis  . Colon cancer Maternal Grandmother   . Colon polyps Maternal Aunt   . Colon polyps Maternal Uncle   . Rectal cancer Maternal Uncle   . Stomach cancer Neg Hx   . Esophageal cancer Neg Hx   . Liver cancer Neg Hx     Social History: Social History   Social History  . Marital status: Married    Spouse name: N/A  . Number of children: 3  . Years of education: 14   Occupational History  . UNEMPLOYED    Social History Main Topics  . Smoking status: Former Smoker    Packs/day: 0.15    Years: 2.00    Types: Cigarettes    Quit date: 09/04/2007  . Smokeless tobacco: Never Used  . Alcohol use 0.0 oz/week     Comment:  Occasionally  . Drug use: No  . Sexual activity: Yes    Birth control/ protection: None   Other Topics Concern  . None   Social History Narrative   Married.  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     Allergies: No Known Allergies  Outpatient Meds: Current Outpatient Prescriptions  Medication Sig Dispense Refill  . budesonide-formoterol (SYMBICORT) 160-4.5 MCG/ACT inhaler Inhale 2 puffs into the lungs 2 (two) times daily. 3 Inhaler 3  . fluconazole (DIFLUCAN) 200 MG tablet Take 1 tablet (200 mg total) by mouth once a week. 2 tablet 1  . meloxicam (MOBIC) 15 MG tablet Take 1 tablet (15 mg total) by mouth daily. (Patient taking differently: Take 15 mg by mouth daily. HASNT STARTED MEDICATION YET) 30 tablet 0  . methocarbamol (ROBAXIN) 500 MG tablet Take 1 tablet (500 mg total) by mouth 2 (two) times daily. (Patient taking differently: Take 500 mg by mouth as needed. ) 20 tablet 0   No current facility-administered medications for this visit.       ___________________________________________________________________ Objective   Exam:  BP 100/74   Pulse 96   Ht  5\' 5"  (1.651 m)   Wt 273 lb 4 oz (123.9 kg)   BMI 45.47 kg/m    General: this is a(n) Morbidly obese young woman   Eyes: sclera anicteric, no redness  ENT: oral mucosa moist without lesions, no cervical or supraclavicular lymphadenopathy, good dentition  CV: RRR without murmur, S1/S2, no JVD, no peripheral edema  Resp: clear to auscultation bilaterally, normal RR and effort noted  GI: soft, no tenderness, with active bowel sounds. No guarding or palpable organomegaly noted.  Skin; warm and dry, no rash or jaundice noted  Neuro: awake, alert and oriented x 3. Normal gross motor function and fluent speech   Assessment: Encounter Diagnosis  Name Primary?  . Family history of colonic polyps Yes    Her family history is  such that she can be screened as average risk.  Plan:  Returned age 30, or sooner if a parent or sibling develops colorectal cancer or advanced adenoma.  Thank you for the courtesy of this consult.  Please call me with any questions or concerns.  Charlie PitterHenry L Danis III  CC: Jeanine Luzalone, Gregory, FNP

## 2016-08-03 NOTE — Patient Instructions (Signed)
If you are age 29 or older, your body mass index should be between 23-30. Your Body mass index is 45.47 kg/m. If this is out of the aforementioned range listed, please consider follow up with your Primary Care Provider.  If you are age 29 or younger, your body mass index should be between 19-25. Your Body mass index is 45.47 kg/m. If this is out of the aformentioned range listed, please consider follow up with your Primary Care Provider.   Thank you for choosing [facility]  Dr Amada JupiterHenry Danis III

## 2016-08-08 ENCOUNTER — Ambulatory Visit: Payer: Commercial Managed Care - PPO | Admitting: Family Medicine

## 2016-10-09 ENCOUNTER — Encounter (HOSPITAL_COMMUNITY): Payer: Self-pay | Admitting: *Deleted

## 2016-10-09 ENCOUNTER — Inpatient Hospital Stay (HOSPITAL_COMMUNITY)
Admission: AD | Admit: 2016-10-09 | Discharge: 2016-10-09 | Disposition: A | Discharge status: Home / Self Care | Payer: Commercial Managed Care - PPO | Source: Ambulatory Visit | Attending: Family Medicine | Admitting: Family Medicine

## 2016-10-09 ENCOUNTER — Inpatient Hospital Stay (HOSPITAL_COMMUNITY): Payer: Commercial Managed Care - PPO

## 2016-10-09 DIAGNOSIS — R1030 Lower abdominal pain, unspecified: Secondary | ICD-10-CM | POA: Diagnosis not present

## 2016-10-09 DIAGNOSIS — K5900 Constipation, unspecified: Secondary | ICD-10-CM

## 2016-10-09 DIAGNOSIS — R102 Pelvic and perineal pain: Secondary | ICD-10-CM | POA: Diagnosis present

## 2016-10-09 DIAGNOSIS — R109 Unspecified abdominal pain: Secondary | ICD-10-CM

## 2016-10-09 LAB — URINALYSIS, ROUTINE W REFLEX MICROSCOPIC
BILIRUBIN URINE: NEGATIVE
Glucose, UA: NEGATIVE mg/dL
Hgb urine dipstick: NEGATIVE
KETONES UR: NEGATIVE mg/dL
Nitrite: NEGATIVE
PH: 5 (ref 5.0–8.0)
PROTEIN: NEGATIVE mg/dL
Specific Gravity, Urine: 1.027 (ref 1.005–1.030)

## 2016-10-09 LAB — CBC WITH DIFFERENTIAL/PLATELET
Basophils Absolute: 0 10*3/uL (ref 0.0–0.1)
Basophils Relative: 1 %
Eosinophils Absolute: 0.3 10*3/uL (ref 0.0–0.7)
Eosinophils Relative: 7 %
HEMATOCRIT: 38.9 % (ref 36.0–46.0)
HEMOGLOBIN: 12.6 g/dL (ref 12.0–15.0)
LYMPHS ABS: 2 10*3/uL (ref 0.7–4.0)
Lymphocytes Relative: 42 %
MCH: 27.1 pg (ref 26.0–34.0)
MCHC: 32.4 g/dL (ref 30.0–36.0)
MCV: 83.7 fL (ref 78.0–100.0)
MONOS PCT: 4 %
Monocytes Absolute: 0.2 10*3/uL (ref 0.1–1.0)
NEUTROS ABS: 2.3 10*3/uL (ref 1.7–7.7)
Neutrophils Relative %: 46 %
Platelets: 263 10*3/uL (ref 150–400)
RBC: 4.65 MIL/uL (ref 3.87–5.11)
RDW: 14.3 % (ref 11.5–15.5)
WBC: 4.8 10*3/uL (ref 4.0–10.5)

## 2016-10-09 LAB — POCT PREGNANCY, URINE: Preg Test, Ur: NEGATIVE

## 2016-10-09 MED ORDER — SIMETHICONE 80 MG PO CHEW
80.0000 mg | CHEWABLE_TABLET | Freq: Once | ORAL | Status: AC
  Administered 2016-10-09: 80 mg via ORAL
  Filled 2016-10-09: qty 1

## 2016-10-09 MED ORDER — DICYCLOMINE HCL 10 MG PO CAPS
10.0000 mg | ORAL_CAPSULE | Freq: Once | ORAL | Status: AC
  Administered 2016-10-09: 10 mg via ORAL
  Filled 2016-10-09: qty 1

## 2016-10-09 MED ORDER — SIMETHICONE 80 MG PO CHEW: 80.0000 mg | CHEWABLE_TABLET | Freq: Four times a day (QID) | ORAL | 0 refills | Status: DC | PRN

## 2016-10-09 MED ORDER — FAMOTIDINE 20 MG PO TABS: 20.0000 mg | ORAL_TABLET | Freq: Two times a day (BID) | ORAL | 0 refills | Status: DC

## 2016-10-09 MED ORDER — FAMOTIDINE 20 MG PO TABS
20.0000 mg | ORAL_TABLET | Freq: Once | ORAL | Status: AC
  Administered 2016-10-09: 20 mg via ORAL
  Filled 2016-10-09: qty 1

## 2016-10-09 NOTE — Discharge Instructions (Signed)
Abdominal Pain, Adult °Many things can cause belly (abdominal) pain. Most times, belly pain is not dangerous. Many cases of belly pain can be watched and treated at home. Sometimes belly pain is serious, though. Your doctor will try to find the cause of your belly pain. °Follow these instructions at home: °· Take over-the-counter and prescription medicines only as told by your doctor. Do not take medicines that help you poop (laxatives) unless told to by your doctor. °· Drink enough fluid to keep your pee (urine) clear or pale yellow. °· Watch your belly pain for any changes. °· Keep all follow-up visits as told by your doctor. This is important. °Contact a doctor if: °· Your belly pain changes or gets worse. °· You are not hungry, or you lose weight without trying. °· You are having trouble pooping (constipated) or have watery poop (diarrhea) for more than 2-3 days. °· You have pain when you pee or poop. °· Your belly pain wakes you up at night. °· Your pain gets worse with meals, after eating, or with certain foods. °· You are throwing up and cannot keep anything down. °· You have a fever. °Get help right away if: °· Your pain does not go away as soon as your doctor says it should. °· You cannot stop throwing up. °· Your pain is only in areas of your belly, such as the right side or the left lower part of the belly. °· You have bloody or black poop, or poop that looks like tar. °· You have very bad pain, cramping, or bloating in your belly. °· You have signs of not having enough fluid or water in your body (dehydration), such as: °¨ Dark pee, very little pee, or no pee. °¨ Cracked lips. °¨ Dry mouth. °¨ Sunken eyes. °¨ Sleepiness. °¨ Weakness. °This information is not intended to replace advice given to you by your health care provider. Make sure you discuss any questions you have with your health care provider. °Document Released: 02/06/2008 Document Revised: 03/09/2016 Document Reviewed: 02/01/2016 °Elsevier  Interactive Patient Education © 2017 Elsevier Inc. °Constipation, Adult °Constipation is when a person: °· Poops (has a bowel movement) fewer times in a week than normal. °· Has a hard time pooping. °· Has poop that is dry, hard, or bigger than normal. °Follow these instructions at home: °Eating and drinking  ° °· Eat foods that have a lot of fiber, such as: °¨ Fresh fruits and vegetables. °¨ Whole grains. °¨ Beans. °· Eat less of foods that are high in fat, low in fiber, or overly processed, such as: °¨ French fries. °¨ Hamburgers. °¨ Cookies. °¨ Candy. °¨ Soda. °· Drink enough fluid to keep your pee (urine) clear or pale yellow. °General instructions  °· Exercise regularly or as told by your doctor. °· Go to the restroom when you feel like you need to poop. Do not hold it in. °· Take over-the-counter and prescription medicines only as told by your doctor. These include any fiber supplements. °· Do pelvic floor retraining exercises, such as: °¨ Doing deep breathing while relaxing your lower belly (abdomen). °¨ Relaxing your pelvic floor while pooping. °· Watch your condition for any changes. °· Keep all follow-up visits as told by your doctor. This is important. °Contact a doctor if: °· You have pain that gets worse. °· You have a fever. °· You have not pooped for 4 days. °· You throw up (vomit). °· You are not hungry. °· You lose weight. °·   You are bleeding from the anus. °· You have thin, pencil-like poop (stool). °Get help right away if: °· You have a fever, and your symptoms suddenly get worse. °· You leak poop or have blood in your poop. °· Your belly feels hard or bigger than normal (is bloated). °· You have very bad belly pain. °· You feel dizzy or you faint. °This information is not intended to replace advice given to you by your health care provider. Make sure you discuss any questions you have with your health care provider. °Document Released: 02/06/2008 Document Revised: 03/09/2016 Document Reviewed:  02/08/2016 °Elsevier Interactive Patient Education © 2017 Elsevier Inc. ° °

## 2016-10-09 NOTE — MAU Provider Note (Signed)
Chief Complaint: Abdominal Pain and Pelvic Pain   SUBJECTIVE HPI: Sherry Ferguson is a 31 y.o. G3P3003 at Unknown who presents to [facility] Admissions reporting pelvic pain and constipation.  FRI- had BM, extremely large amounts of stool, soft. But did not feel empty. SAT- no BM, felt like she needed to have BM. SUN- no BM; then had sharp, stabbing pain in lower pelvis radiating upward. Difficult to walk or stand up straight due to pain. Lasted for about 2 hours, then became dull constant pain. Took caster oil, had more BMs, but dull pain remained.  MON- no BM but felt like had to have BM. Took Magnesium citrate, then around midnight, had BM -multiple, loose stools and watery. Bloating is present. Still having dull achy pain.   Denies hematochezia or melena. Denies vomiting, +nausea. +flatus. Denies F/C. Denies vaginal complaints. Has irregular periods. +weight loss of 10 lbs in last 2 weeks, unintentionally. Patient states she does not typically have constipation, typically has BM daily and are soft. This has happened before, 10 times in past year.   Has family members with IBS, but patient denies this. GMA had colon CA 46(30 y/o), maternal uncle x2 (42y/o and 30 y/o). Multiple maternal side of family with polyps.   Patient states she has irregular menses, not having any abnormal vaginal discharge, not currently pregnancy (UPT neg), denies abnormal VB. Last pap was in 2015, was neg.   Past Medical History:  Diagnosis Date  . Allergy   . Asthma   . Bronchitis, acute   . IUD (intrauterine device) in place    no longer has IUD- removed in 2015   OB History  Gravida Para Term Preterm AB Living  3 3 3     3   SAB TAB Ectopic Multiple Live Births        0 1    # Outcome Date GA Lbr Len/2nd Weight Sex Delivery Anes PTL Lv  3 Term 02/07/15 861w6d 05:24 / 00:04 6 lb 15.1 oz (3.15 kg) F Vag-Spont EPI  LIV     Birth Comments: Hgb, normal, FA Newborn Screen Barcode: [id number] Date Collected:  02/08/2015  2 Term 02/10/09 829w0d   F Vag-Spont     1 Term 12/08/05 349w0d   F Vag-Spont        Past Surgical History:  Procedure Laterality Date  . NO PAST SURGERIES     Social History   Social History  . Marital status: Married    Spouse name: N/A  . Number of children: 3  . Years of education: 14   Occupational History  . UNEMPLOYED    Social History Main Topics  . Smoking status: Former Smoker    Packs/day: 0.15    Years: 2.00    Types: Cigarettes    Quit date: 09/04/2007  . Smokeless tobacco: Never Used  . Alcohol use 0.0 oz/week     Comment: Occasionally  . Drug use: No  . Sexual activity: Yes    Birth control/ protection: None   Other Topics Concern  . Not on file   Social History Narrative   Married.  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    No current facility-administered medications on file prior to encounter.    Current Outpatient Prescriptions on File Prior to Encounter  Medication Sig Dispense Refill  . budesonide-formoterol (SYMBICORT) 160-4.5 MCG/ACT inhaler Inhale 2 puffs into  the lungs 2 (two) times daily. 3 Inhaler 3  . meloxicam (MOBIC) 15 MG tablet Take 1 tablet (15 mg total) by mouth daily. (Patient taking differently: Take 15 mg by mouth daily as needed for pain. ) 30 tablet 0  . methocarbamol (ROBAXIN) 500 MG tablet Take 1 tablet (500 mg total) by mouth 2 (two) times daily. (Patient taking differently: Take 500 mg by mouth as needed. ) 20 tablet 0  . fluconazole (DIFLUCAN) 200 MG tablet Take 1 tablet (200 mg total) by mouth once a week. (Patient not taking: Reported on 10/09/2016) 2 tablet 1   No Known Allergies  I have reviewed the past Medical Hx, Surgical Hx, Social Hx, Allergies and Medications.   REVIEW OF SYSTEMS  A comprehensive ROS was negative except per HPI.    OBJECTIVE Patient Vitals for the past 24 hrs:  BP Temp Temp src Pulse Resp Weight   10/09/16 1409 119/87 99.1 F (37.3 C) Oral 91 18 263 lb 12 oz (119.6 kg)    PHYSICAL EXAM Constitutional: Well-developed, well-nourished, morbidly obese female in no acute distress.  Cardiovascular: normal rate, rhythm, no murmurs Respiratory: normal rate and effort. CTAB GI: Abd soft, generalized abdominal tenderness without guarding or rebound tenderness, non-distended. Pos BS x 4 but decreased. MS: Extremities nontender, no edema, normal ROM Neurologic: Alert and oriented x 4.  GU: Neg CVAT.   LAB RESULTS Results for orders placed or performed during the hospital encounter of 10/09/16 (from the past 24 hour(s))  Urinalysis, Routine w reflex microscopic     Status: Abnormal   Collection Time: 10/09/16  2:10 PM  Result Value Ref Range   Color, Urine YELLOW YELLOW   APPearance HAZY (A) CLEAR   Specific Gravity, Urine 1.027 1.005 - 1.030   pH 5.0 5.0 - 8.0   Glucose, UA NEGATIVE NEGATIVE mg/dL   Hgb urine dipstick NEGATIVE NEGATIVE   Bilirubin Urine NEGATIVE NEGATIVE   Ketones, ur NEGATIVE NEGATIVE mg/dL   Protein, ur NEGATIVE NEGATIVE mg/dL   Nitrite NEGATIVE NEGATIVE   Leukocytes, UA TRACE (A) NEGATIVE   RBC / HPF 0-5 0 - 5 RBC/hpf   WBC, UA 0-5 0 - 5 WBC/hpf   Bacteria, UA RARE (A) NONE SEEN   Squamous Epithelial / LPF 0-5 (A) NONE SEEN   Mucous PRESENT   Pregnancy, urine POC     Status: None   Collection Time: 10/09/16  2:42 PM  Result Value Ref Range   Preg Test, Ur NEGATIVE NEGATIVE    IMAGING No results found.  MAU COURSE Bentyl Abd XR - normal CBC - no sign of infection GC/CT - pending VSS, Afebrile Simethicone, famotidine - pain decreased by half after medication   MDM Plan of care reviewed with patient, including labs and tests ordered and medical treatment. No acute abdomen observed, no sign of infection (No leukocytosis, afebrile, not tachycardic, UA normal), this is long-standing (occurs 10x/year), and discussed with patient importance of following  up with PCP/GI. Needs to also be seen by OB/GYN outpatient for repeat pap and workup oligomenorrhea. Discussed warning signs/symptoms, such as fevers, severe/worsening abdominal pain, N/V, melena/hematochezia, etc.    ASSESSMENT 1. Lower abdominal pain   2. Abdominal pain   3. Constipation, unspecified constipation type     PLAN Discharge home in stable condition. Follow up with GI/PCP/OB outpatient Rx given    Allergies as of 10/09/2016   No Known Allergies     Medication List    STOP taking these medications  fluconazole 200 MG tablet Commonly known as:  DIFLUCAN     TAKE these medications   budesonide-formoterol 160-4.5 MCG/ACT inhaler Commonly known as:  SYMBICORT Inhale 2 puffs into the lungs 2 (two) times daily.   castor oil liquid Take 15 mLs by mouth daily as needed for moderate constipation.   famotidine 20 MG tablet Commonly known as:  PEPCID Take 1 tablet (20 mg total) by mouth 2 (two) times daily.   ibuprofen 200 MG tablet Commonly known as:  ADVIL,MOTRIN Take 800 mg by mouth every 6 (six) hours as needed for moderate pain.   MAGNESIUM CITRATE PO Take 1 Bottle by mouth once.   meloxicam 15 MG tablet Commonly known as:  MOBIC Take 1 tablet (15 mg total) by mouth daily. What changed:  when to take this  reasons to take this   methocarbamol 500 MG tablet Commonly known as:  ROBAXIN Take 1 tablet (500 mg total) by mouth 2 (two) times daily. What changed:  when to take this  reasons to take this   simethicone 80 MG chewable tablet Commonly known as:  MYLICON Chew 1 tablet (80 mg total) by mouth every 6 (six) hours as needed for flatulence (cramping).        Jen Mow, DO [place] [facility] 10/09/2016 4:09 PM

## 2016-10-09 NOTE — MAU Note (Signed)
on Friday, had a 30min BM, at end of that time had to leave to go to work, still didn't feel like done.  Sat, she bent over and immediately got a headache. Sunday, tried to use the bathroom, nothing, had not had a BM on Sat- when walking to bedroom had a sudden pain in pelvis, required help to walk.  Thought she might be constipated.  Took some castor oil, loosened things up, some results.  Was still having pelvic pain, but was still bloated, just not as bad. Mon, pain was still kind of there, but still had not used the bathroom.  Took a bottle of Mag Citrate.  Started using the bathroom from midnight to 0300.  Still hurting to sit down, still having pain in lower stomach and pelvis. Hurts to walk.

## 2017-10-25 DIAGNOSIS — N644 Mastodynia: Secondary | ICD-10-CM | POA: Diagnosis not present

## 2017-10-25 DIAGNOSIS — J4540 Moderate persistent asthma, uncomplicated: Secondary | ICD-10-CM | POA: Diagnosis not present

## 2017-10-29 DIAGNOSIS — J4540 Moderate persistent asthma, uncomplicated: Secondary | ICD-10-CM | POA: Diagnosis not present

## 2017-10-31 ENCOUNTER — Other Ambulatory Visit: Payer: Self-pay | Admitting: Family Medicine

## 2017-10-31 DIAGNOSIS — N644 Mastodynia: Secondary | ICD-10-CM

## 2017-11-01 DIAGNOSIS — E785 Hyperlipidemia, unspecified: Secondary | ICD-10-CM | POA: Diagnosis not present

## 2017-11-01 DIAGNOSIS — R7303 Prediabetes: Secondary | ICD-10-CM | POA: Diagnosis not present

## 2017-11-04 ENCOUNTER — Ambulatory Visit
Admission: RE | Admit: 2017-11-04 | Discharge: 2017-11-04 | Disposition: A | Discharge status: Home / Self Care | Payer: Commercial Managed Care - PPO | Source: Ambulatory Visit | Attending: Family Medicine | Admitting: Family Medicine

## 2017-11-04 ENCOUNTER — Ambulatory Visit: Payer: Commercial Managed Care - PPO

## 2017-11-04 DIAGNOSIS — N644 Mastodynia: Secondary | ICD-10-CM

## 2017-11-04 DIAGNOSIS — R928 Other abnormal and inconclusive findings on diagnostic imaging of breast: Secondary | ICD-10-CM | POA: Diagnosis not present

## 2017-11-05 DIAGNOSIS — Z800 Family history of malignant neoplasm of digestive organs: Secondary | ICD-10-CM | POA: Diagnosis not present

## 2017-11-13 DIAGNOSIS — Z800 Family history of malignant neoplasm of digestive organs: Secondary | ICD-10-CM | POA: Diagnosis not present

## 2017-11-13 DIAGNOSIS — Z1211 Encounter for screening for malignant neoplasm of colon: Secondary | ICD-10-CM | POA: Diagnosis not present

## 2017-11-13 DIAGNOSIS — Z8371 Family history of colonic polyps: Secondary | ICD-10-CM | POA: Diagnosis not present

## 2017-12-09 ENCOUNTER — Encounter: Payer: Commercial Managed Care - PPO | Attending: Family Medicine | Admitting: Skilled Nursing Facility1

## 2017-12-09 ENCOUNTER — Encounter: Payer: Self-pay | Admitting: Skilled Nursing Facility1

## 2017-12-09 DIAGNOSIS — E669 Obesity, unspecified: Secondary | ICD-10-CM

## 2017-12-09 DIAGNOSIS — Z713 Dietary counseling and surveillance: Secondary | ICD-10-CM | POA: Insufficient documentation

## 2017-12-09 NOTE — Patient Instructions (Addendum)
-  [employer] Immunologistllyn Satter  -Change up when the family eats when your daughter has dance: sit with your daughter so she is not alone  -USDA mixing bowl for recipes  -Get a filter for your faucet   -Aim for 64 fluid ounces a day, every day consistently  -Eat every 3-5 hours   -Use your meal idea sheet to make some meals

## 2017-12-09 NOTE — Progress Notes (Signed)
  Assessment:  Primary concerns today: obesity.  Pt state she wants to learn how to eat healthy. Pt states she knows she does not eat enough. Pt states her usual weight as an adult is where she is at but gained most of her weight after having children. Pt states she wants to be 150 pounds. Pt states she has a bowel movement every day to every other day. Pt states she does not have good sleep patterns: go to sleep 12-1am with tossing and turning hearing everything throughout the night waking at 5:30am. Pt states she is taking adderall now. Pt states she does not have energy and is in bed every night at 8pm. Pt states she cannot eat cold things in the morning.    MEDICATIONS: see list   DIETARY INTAKE:  Usual eating pattern includes 2 meals and 1 snacks per day.  Everyday foods include none stated.  Avoided foods include none stated.    24-hr recall: fast food about 5+ meals a week B ( AM): tuna salad---leftovers---pizza---mac n cheese and green beans---skipped Snk ( AM):  L ( PM): skipped---leftovers  Snk ( PM):  D ( PM): pizza Snk ( PM):  Beverages: coffee with cream and sugar, water  Usual physical activity: ADL's  Estimated energy needs: 1500 calories 170 g carbohydrates 112 g protein 42 g fat  Progress Towards Goal(s):  In progress.   Nutritional Diagnosis:  Bath-3.3 Overweight/obesity As related to overconsumption of fast food.  As evidenced by pt report and BMI 44.73.     Intervention:  Nutrition counseling for weight management. Dietitian educated the pt on a healthy diet plan. Goals: -Arts development officer -Change up when the family eats when your daughter has dance: sit with your daughter so she is not alone -USDA mixing bowl for recipes -Get a filter for your faucet  -Aim for 64 fluid ounces a day, every day consistently -Eat every 3-5 hours  -Use your meal idea sheet to make some meals   Teaching Method Utilized:  Visual Auditory Hands on  Handouts given during  visit include:  Meal ideas  Barriers to learning/adherence to lifestyle change: none identified   Demonstrated degree of understanding via:  Teach Back   Monitoring/Evaluation:  Dietary intake, exercise,and body weight prn.

## 2017-12-31 DIAGNOSIS — F5101 Primary insomnia: Secondary | ICD-10-CM | POA: Diagnosis not present

## 2017-12-31 DIAGNOSIS — J4540 Moderate persistent asthma, uncomplicated: Secondary | ICD-10-CM | POA: Diagnosis not present

## 2018-01-06 ENCOUNTER — Ambulatory Visit: Payer: Commercial Managed Care - PPO | Admitting: Skilled Nursing Facility1

## 2018-01-10 DIAGNOSIS — J029 Acute pharyngitis, unspecified: Secondary | ICD-10-CM | POA: Diagnosis not present

## 2018-07-25 DIAGNOSIS — S40012A Contusion of left shoulder, initial encounter: Secondary | ICD-10-CM | POA: Diagnosis not present

## 2018-07-25 DIAGNOSIS — S20229A Contusion of unspecified back wall of thorax, initial encounter: Secondary | ICD-10-CM | POA: Diagnosis not present

## 2018-11-19 DIAGNOSIS — J4540 Moderate persistent asthma, uncomplicated: Secondary | ICD-10-CM | POA: Diagnosis not present

## 2020-01-27 ENCOUNTER — Encounter: Payer: Self-pay | Admitting: Emergency Medicine

## 2020-01-27 ENCOUNTER — Ambulatory Visit
Admission: EM | Admit: 2020-01-27 | Discharge: 2020-01-27 | Disposition: A | Discharge status: Home / Self Care | Payer: Commercial Managed Care - PPO

## 2020-01-27 ENCOUNTER — Other Ambulatory Visit: Payer: Self-pay

## 2020-01-27 DIAGNOSIS — M546 Pain in thoracic spine: Secondary | ICD-10-CM

## 2020-01-27 MED ORDER — CYCLOBENZAPRINE HCL 5 MG PO TABS: 5.0000 mg | ORAL_TABLET | Freq: Two times a day (BID) | ORAL | 0 refills | Status: AC | PRN

## 2020-01-27 MED ORDER — METHYLPREDNISOLONE SODIUM SUCC 125 MG IJ SOLR
125.0000 mg | Freq: Once | INTRAMUSCULAR | Status: AC
  Administered 2020-01-27: 125 mg via INTRAMUSCULAR

## 2020-01-27 NOTE — Discharge Instructions (Addendum)
Recommend RICE: rest, ice, compression, elevation as needed for pain.    Heat therapy (hot compress, warm wash rag, hot showers, etc.) can help relax muscles and soothe muscle aches. Cold therapy (ice packs) can be used to help swelling both after injury and after prolonged use of areas of chronic pain/aches.  For pain: Take Mobic once daily with food.  May take Tylenol additionally if needed.  May take muscle relaxer as needed for severe pain / spasm.  (This medication may cause you to become tired so it is important you do not drink alcohol or operate heavy machinery while on this medication.  Recommend your first dose to be taken before bedtime to monitor for side effects safely)  Return for worsening pain, fever, abdominal pain, leg weakness or numbness, difficulty urinating or having bowel movements.
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
## 2020-01-27 NOTE — ED Triage Notes (Signed)
Pt c/o lt upper back pain since 3am this morning while sleeping. Denies injury. States saw her PCP for lt elbow pain and tx'd with mobic that she hasn't started taking yet.

## 2021-08-12 ENCOUNTER — Encounter: Payer: Self-pay | Admitting: Emergency Medicine

## 2021-08-12 ENCOUNTER — Ambulatory Visit
Admission: EM | Admit: 2021-08-12 | Discharge: 2021-08-12 | Disposition: A | Discharge status: Home / Self Care | Payer: 59 | Attending: Physician Assistant | Admitting: Physician Assistant

## 2021-08-12 ENCOUNTER — Other Ambulatory Visit: Payer: Self-pay

## 2021-08-12 DIAGNOSIS — M25512 Pain in left shoulder: Secondary | ICD-10-CM

## 2021-08-12 MED ORDER — PREDNISONE 20 MG PO TABS: 40.0000 mg | ORAL_TABLET | Freq: Every day | ORAL | 0 refills | Status: AC

## 2021-08-12 MED ORDER — CYCLOBENZAPRINE HCL 10 MG PO TABS: 10.0000 mg | ORAL_TABLET | Freq: Two times a day (BID) | ORAL | 0 refills | Status: DC | PRN

## 2021-08-12 NOTE — ED Provider Notes (Signed)
[name]    CSN: [id number] Arrival date & time: 08/12/21  1203      History   Chief Complaint Chief Complaint  Patient presents with   Shoulder Pain    HPI Sherry Ferguson is a 35 y.o. female.   Patient here today for evaluation of left shoulder pain that started 3 days ago after she lifted heavy box at work.  She reports that movement makes pain worse.  She will occasionally have a burning sensation that radiates into her neck as well as down her left arm.  She denies any numbness and tingling.  She has tried ibuprofen, meloxicam, and Goody powders without significant relief.  The history is provided by the patient.  Shoulder Pain Associated symptoms: no fever    Past Medical History:  Diagnosis Date   Allergy    Asthma    Bronchitis, acute    IUD (intrauterine device) in place    no longer has IUD- removed in 2015    Patient Active Problem List   Diagnosis Date Noted   Asthma 07/19/2016   Patellofemoral syndrome of both knees 06/27/2016   Degenerative arthritis of right knee 06/27/2016   Family history of familial polyposis 05/29/2016   Family history of colon cancer 05/29/2016   Ringworm 05/29/2016   Asthma, moderate persistent 05/29/2016   Rhinitis, allergic 05/29/2016   Abdominal pain in pregnancy 02/07/2015   NSVD (normal spontaneous vaginal delivery) 02/07/2015   Acute infection of nasal sinus 01/26/2015   Supervision of normal pregnancy in third trimester 12/31/2014   Traumatic injury during pregnancy, antepartum    Obesity affecting pregnancy in second trimester, antepartum    Subchorionic hemorrhage in first trimester 07/07/2014   Obesity (BMI 30-39.9) 02/01/2011    Past Surgical History:  Procedure Laterality Date   NO PAST SURGERIES      OB History     Gravida  3   Para  3   Term  3   Preterm      AB      Living  3      SAB      IAB      Ectopic      Multiple  0   Live Births  1            Home  Medications    Prior to Admission medications   Medication Sig Start Date End Date Taking? Authorizing Provider  budesonide-formoterol (SYMBICORT) 160-4.5 MCG/ACT inhaler Inhale 2 puffs into the lungs 2 (two) times daily. 07/19/16  Yes Veryl Speak, FNP  cyclobenzaprine (FLEXERIL) 10 MG tablet Take 1 tablet (10 mg total) by mouth 2 (two) times daily as needed for muscle spasms. 08/12/21  Yes Tomi Bamberger, PA-C  meloxicam (MOBIC) 15 MG tablet Take 1 tablet (15 mg total) by mouth daily. Patient taking differently: Take 15 mg by mouth daily as needed for pain. 06/27/16  Yes Judi Saa, DO  predniSONE (DELTASONE) 20 MG tablet Take 2 tablets (40 mg total) by mouth daily with breakfast for 5 days. 08/12/21 08/17/21 Yes Tomi Bamberger, PA-C  amphetamine-dextroamphetamine (ADDERALL) 20 MG tablet Take 20 mg by mouth daily.    [provider]  FLUoxetine (PROZAC) 10 MG tablet Take 10 mg by mouth daily.    [provider]  ibuprofen (ADVIL,MOTRIN) 200 MG tablet Take 800 mg by mouth every 6 (six) hours as needed for moderate pain.    [provider]    [facility]  History Family History  Problem Relation Age of Onset   Asthma Mother    Colon polyps Mother    Diabetes Father    Hyperlipidemia Father    Hypertension Father    Bronchitis Father    Heart attack Father    Colon polyps Maternal Aunt    Colon cancer Maternal Uncle 46       stage 4 at diagnosis   Colon cancer Maternal Grandmother    Colon polyps Maternal Aunt    Colon polyps Maternal Uncle    Rectal cancer Maternal Uncle    Breast cancer Paternal Grandmother    Stomach cancer Neg Hx    Esophageal cancer Neg Hx    Liver cancer Neg Hx     Social History Social History   Tobacco Use   Smoking status: Former    Packs/day: 0.15    Years: 2.00    Pack years: 0.30    Types: Cigarettes    Quit date: 09/04/2007    Years since quitting: 13.9   Smokeless tobacco: Never  Substance Use Topics    Alcohol use: Yes    Comment: Occasionally   Drug use: No     Allergies   Patient has no known allergies.   Review of Systems Review of Systems  Constitutional:  Negative for chills and fever.  Eyes:  Negative for discharge and redness.  Respiratory:  Negative for shortness of breath.   Gastrointestinal:  Negative for abdominal pain, nausea and vomiting.  Genitourinary:  Positive for vaginal bleeding and vaginal discharge.  Musculoskeletal:  Positive for arthralgias and myalgias.  Neurological:  Negative for numbness.    Physical Exam Triage Vital Signs ED Triage Vitals  Enc Vitals Group     BP 08/12/21 1424 120/81     Pulse Rate 08/12/21 1424 78     Resp --      Temp 08/12/21 1424 98.4 F (36.9 C)     Temp Source 08/12/21 1424 Oral     SpO2 08/12/21 1424 94 %     Weight 08/12/21 1425 (!) 306 lb (138.8 kg)     Height 08/12/21 1425 5\' 5"  (1.651 m)     Head Circumference --      Peak Flow --      Pain Score 08/12/21 1425 8     Pain Loc --      Pain Edu? --      Excl. in GC? --    No data found.  Updated Vital Signs BP 120/81 (BP Location: Left Arm)   Pulse 78   Temp 98.4 F (36.9 C) (Oral)   Ht 5\' 5"  (1.651 m)   Wt (!) 306 lb (138.8 kg)   SpO2 94%   BMI 50.92 kg/m      Physical Exam Vitals and nursing note reviewed.  Constitutional:      General: She is not in acute distress.    Appearance: Normal appearance. She is not ill-appearing.  HENT:     Head: Normocephalic and atraumatic.  Eyes:     Conjunctiva/sclera: Conjunctivae normal.  Cardiovascular:     Rate and Rhythm: Normal rate.  Pulmonary:     Effort: Pulmonary effort is normal.  Musculoskeletal:     Comments: Full ROM of neck, Decreased ROM of left shoulder in all directions due to pain, TTP noted diffusely to left shoulder, left trapezius, SCM, no TTP to midline C spine  Skin:    Capillary Refill: Brisk cap refill to left fingertips Neurological:  Mental Status: She is alert.      Comments: Gross sensation intact to distal left fingers  Psychiatric:        Mood and Affect: Mood normal.        Behavior: Behavior normal.        Thought Content: Thought content normal.     UC Treatments / Results  Labs (all labs ordered are listed, but only abnormal results are displayed) Labs Reviewed - No data to display  EKG   Radiology No results found.  Procedures Procedures (including critical care time)  Medications Ordered in UC Medications - No data to display  Initial Impression / Assessment and Plan / UC Course  I have reviewed the triage vital signs and the nursing notes.  Pertinent labs & imaging results that were available during my care of the patient were reviewed by me and considered in my medical decision making (see chart for details).    Suspect muscular strain. Will treat with steroid and muscle relaxer. Encouraged tyelnol if needed for pain while taking steroid. Encourage follow up if symptoms fail to improve or worsen.   Final Clinical Impressions(s) / UC Diagnoses   Final diagnoses:  Acute pain of left shoulder   Discharge Instructions   None    ED Prescriptions     Medication Sig Dispense Auth. Provider   predniSONE (DELTASONE) 20 MG tablet Take 2 tablets (40 mg total) by mouth daily with breakfast for 5 days. 10 tablet Erma Pinto F, PA-C   cyclobenzaprine (FLEXERIL) 10 MG tablet Take 1 tablet (10 mg total) by mouth 2 (two) times daily as needed for muscle spasms. 20 tablet Tomi Bamberger, PA-C      PDMP not reviewed this encounter.   Tomi Bamberger, PA-C 08/12/21 1450

## 2021-08-12 NOTE — ED Triage Notes (Signed)
Patient c/o left shoulder, neck pain x 3 days.  Patient states that she was lifting some notebook at work and began having pain afterwards.  Patient has been taken Meloxicam w/o relief, Goody's w/o relief.

## 2021-10-17 DIAGNOSIS — Z0289 Encounter for other administrative examinations: Secondary | ICD-10-CM

## 2021-10-25 ENCOUNTER — Encounter (INDEPENDENT_AMBULATORY_CARE_PROVIDER_SITE_OTHER): Payer: Self-pay | Admitting: Family Medicine

## 2021-10-25 ENCOUNTER — Ambulatory Visit (INDEPENDENT_AMBULATORY_CARE_PROVIDER_SITE_OTHER): Payer: 59 | Admitting: Family Medicine

## 2021-10-25 ENCOUNTER — Other Ambulatory Visit: Payer: Self-pay

## 2021-10-25 VITALS — BP 130/86 | HR 73 | Temp 98.3°F | Ht 65.0 in | Wt 305.0 lb

## 2021-10-25 DIAGNOSIS — Z6841 Body Mass Index (BMI) 40.0 and over, adult: Secondary | ICD-10-CM

## 2021-10-25 DIAGNOSIS — Z1331 Encounter for screening for depression: Secondary | ICD-10-CM

## 2021-10-25 DIAGNOSIS — E559 Vitamin D deficiency, unspecified: Secondary | ICD-10-CM

## 2021-10-25 DIAGNOSIS — R0602 Shortness of breath: Secondary | ICD-10-CM

## 2021-10-25 DIAGNOSIS — J45909 Unspecified asthma, uncomplicated: Secondary | ICD-10-CM | POA: Insufficient documentation

## 2021-10-25 DIAGNOSIS — R519 Headache, unspecified: Secondary | ICD-10-CM | POA: Diagnosis not present

## 2021-10-25 DIAGNOSIS — J4520 Mild intermittent asthma, uncomplicated: Secondary | ICD-10-CM

## 2021-10-25 DIAGNOSIS — R5383 Other fatigue: Secondary | ICD-10-CM

## 2021-10-25 DIAGNOSIS — R7303 Prediabetes: Secondary | ICD-10-CM

## 2021-10-25 DIAGNOSIS — E668 Other obesity: Secondary | ICD-10-CM

## 2021-10-25 DIAGNOSIS — Z9189 Other specified personal risk factors, not elsewhere classified: Secondary | ICD-10-CM

## 2021-10-25 DIAGNOSIS — F39 Unspecified mood [affective] disorder: Secondary | ICD-10-CM
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
## 2021-10-25 NOTE — Progress Notes (Signed)
Dear Dr. Hyacinth Meeker,   Thank you for referring Sherry Ferguson to our clinic. The following note includes my evaluation and treatment recommendations.  Chief Complaint:   OBESITY Sherry Ferguson (MR# [id number]) is a 36 y.o. female who presents for evaluation and treatment of obesity and related comorbidities. Current Body mass index is 50.75 kg/m. Sherry Ferguson has been struggling with her weight for many years and has been unsuccessful in either losing weight, maintaining weight loss, or reaching her healthy weight goal.  Sherry Ferguson is currently in the action stage of change and ready to dedicate time achieving and maintaining a healthier weight. Sherry Ferguson is interested in becoming our patient and working on intensive lifestyle modifications including (but not limited to) diet and exercise for weight loss.  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 She has a spouse, Sherry Ferguson, and 4 kids at home. She has never been on a diet or with a weight loss program before. She skips breakfast or lunch and drinks a lot of caloric beverages. Her worst habit is overloading plate and feeling she has to eat it all. Her PCP is Sigmund Hazel, MD at [place].  Sherry Ferguson's habits were reviewed today and are as follows: Her family eats meals together, she thinks her family will eat healthier with her, her desired weight loss is 150 lbs, she started gaining weight after having children/pandemic, her heaviest weight ever was 308 pounds, she skips meals frequently, she is frequently drinking liquids with calories, she frequently makes poor food choices, she frequently eats larger portions than normal, she has binge eating behaviors, and she struggles with emotional eating.  Depression Screen Cynia's Food and Mood (modified PHQ-9) score was 21.  Depression screen [facility] 2/9 10/25/2021  Decreased Interest 3  Down, Depressed, Hopeless 3  PHQ - 2 Score 6  Altered sleeping 3  Tired, decreased energy 3   Change in appetite 3  Feeling bad or failure about yourself  3  Moving slowly or fidgety/restless 3  Suicidal thoughts 0  PHQ-9 Score 21  Difficult doing work/chores Not difficult at all   Subjective:   1. Other fatigue Sherry Ferguson denies daytime somnolence and admits to waking up still tired. Patient has a history of symptoms of morning fatigue and morning headache. Latika generally gets 5 or 6 hours of sleep per night, and states that she has poor sleep quality. Snoring is present. Apneic episodes are not present. Epworth Sleepiness Score is 1. Pt gained 55 lbs over the pandemic and now finds it difficult to breath with exercise or physically do what she wants to do.  2. SOB (shortness of breath) on exertion Sherry Ferguson notes increasing shortness of breath with exercising and seems to be worsening over time with weight gain. She notes getting out of breath sooner with activity than she used to. This has gotten worse recently. Adalena denies shortness of breath at rest or orthopnea.  3. Prediabetes Pt eats a lot of simple carbs in her diet currently. Medication: None  4. Mild intermittent reactive airway disease without complication Pt reports SOB from time to time, but overall mostly with exercise induced asthma. She denies issues now. Medication: Symbicort and Albuterol  5. Headaches, Nonintractable episodic, unspecified headache type Pt notes she has only ever needed OTC Goody's or Tylenol prn. She has no acute issues currently.  6. Vitamin D deficiency Pt reports fatigue and also depressed mood when she was deficient. Sxs have improved tremendously after starting Ergocalciferol weekly.  7. Mood disorder (HCC)- emotional eating Pt with mixed anxiety and depression. PHQ= 21. Sherry Ferguson denies suicidal or homicidal ideations.  8. At risk for impaired metabolic function Sherry Ferguson is at increased risk for impaired metabolic function due to pre-diabetes, Vit D deficiency, and  obesity  Assessment/Plan:   Orders Placed This Encounter  Procedures   Vitamin B12   CBC with Differential/Platelet   Comprehensive metabolic panel   Folate   Hemoglobin A1c   Insulin, random   Lipid Panel With LDL/HDL Ratio   T4, free   TSH   VITAMIN D 25 Hydroxy (Vit-D Deficiency, Fractures)   EKG 12-Lead    Medications Discontinued During This Encounter  Medication Reason   cyclobenzaprine (FLEXERIL) 10 MG tablet    ibuprofen (ADVIL,MOTRIN) 200 MG tablet    meloxicam (MOBIC) 15 MG tablet      No orders of the defined types were placed in this encounter.    1. Other fatigue Sherry Ferguson does feel that her weight is causing her energy to be lower than it should be. Fatigue may be related to obesity, depression or many other causes. Labs will be ordered, and in the meanwhile, Sherry Ferguson will focus on self care including making healthy food choices, increasing physical activity and focusing on stress reduction.  - EKG 12-Lead  2. SOB (shortness of breath) on exertion Sherry Ferguson does feel that she gets out of breath more easily that she used to when she exercises. Sherry Ferguson's shortness of breath appears to be obesity related and exercise induced. She has agreed to work on weight loss and gradually increase exercise to treat her exercise induced shortness of breath. Will continue to monitor closely.  3. Prediabetes Focus on prudent nutritional plan, weight loss, and decrease simple sugars. Will consider meds if appropriate. Check labs today.  - CBC with Differential/Platelet - Comprehensive metabolic panel - Hemoglobin A1c - Insulin, random - Lipid Panel With LDL/HDL Ratio  4. Mild intermittent reactive airway disease without complication Sxs well controlled currently. Continue meds, per PCP/specialist and engage in weight loss. Follow prudent lifestyle recommendations.  5. Headaches, Nonintractable episodic, unspecified headache type Stable. Adequately hydrate. Check labs today.  -  CBC with Differential/Platelet - Comprehensive metabolic panel  6. Vitamin D deficiency Low Vitamin D level contributes to fatigue and are associated with obesity, breast, and colon cancer. She agrees to continue to take prescription Vitamin D 50,000 IU every week and will follow-up for routine testing of Vitamin D, at least 2-3 times per year to avoid over-replacement. Check labs today.  - VITAMIN D 25 Hydroxy (Vit-D Deficiency, Fractures)  7. Mood disorder (HCC)- emotional eating Pt denies feeling depressed at all, despite PHQ score. Referral to Dr. Dewaine CongerBarker. Pt advised to not let food on plate touch and mindful eating. Strategies discussed with pt and handout given. Check labs today.  - Vitamin B12 - Folate - T4, free - TSH  8. Depression screen Sherry Ferguson had a positive depression screening. Depression is commonly associated with obesity and often results in emotional eating behaviors. We will monitor this closely and work on CBT to help improve the non-hunger eating patterns. Referral to Psychology may be required if no improvement is seen as she continues in our clinic.  9. At risk for impaired metabolic function At least 24 minutes was spent on counseling Sherry Ferguson about these concerns today. This places the patient at a much greater risk to subsequently develop cardio-pulmonary conditions that can negatively affect the patient's quality of life. I stressed the importance  of reversing these risks factors.  The initial goal is to lose at least 5-10% of starting weight to help reduce risk factors. Counseling:  Intensive lifestyle modifications discussed with Sherry Ferguson as the most appropriate first line treatment.  she will continue to work on diet, exercise, and weight loss efforts.  We will continue to reassess these conditions on a fairly regular basis in an attempt to decrease the patient's overall morbidity and mortality.  10. Class 3 severe obesity with serious comorbidity and body mass index  (BMI) of 50.0 to 59.9 in adult, unspecified obesity type [facility]) Sherry Ferguson is currently in the action stage of change and her goal is to continue with weight loss efforts. I recommend Sherry Ferguson begin the structured treatment plan as follows:  She has agreed to the Category 2 Plan.  Exercise goals:  As is    Behavioral modification strategies: increasing lean protein intake, decreasing simple carbohydrates, no skipping meals, keeping healthy foods in the home, and planning for success.  She was informed of the importance of frequent follow-up visits to maximize her success with intensive lifestyle modifications for her multiple health conditions. She was informed we would discuss her lab results at her next visit unless there is a critical issue that needs to be addressed sooner. Sherry Ferguson agreed to keep her next visit at the agreed upon time to discuss these results.  Objective:   Blood pressure 130/86, pulse 73, temperature 98.3 F (36.8 C), temperature source Oral, height 5\' 5"  (1.651 m), weight (!) 305 lb (138.3 kg), last menstrual period 09/08/2021, SpO2 100 %. Body mass index is 50.75 kg/m.  EKG: Abnormal- sinus rhythm, rate 78.  Indirect Calorimeter completed today shows a VO2 of 276 and a REE of 1901.  Her calculated basal metabolic rate is 2458 thus her basal metabolic rate is worse than expected.  General: Cooperative, alert, well developed, in no acute distress. HEENT: Conjunctivae and lids unremarkable. Cardiovascular: Regular rhythm.  Lungs: Normal work of breathing. Neurologic: No focal deficits.   Lab Results  Component Value Date   CREATININE 0.62 09/22/2012   BUN 10 09/22/2012   NA 138 09/22/2012   K 4.3 09/22/2012   CL 107 09/22/2012   CO2 23 09/22/2012   Lab Results  Component Value Date   ALT 20 04/30/2011   AST 14 04/30/2011   ALKPHOS 69 04/30/2011   BILITOT 0.4 04/30/2011   No results found for: HGBA1C No results found for: INSULIN Lab Results  Component Value  Date   TSH 1.609 05/26/2010   Lab Results  Component Value Date   CHOL 168 02/01/2011   HDL 40 02/01/2011   LDLCALC 109 (H) 02/01/2011   TRIG 96 02/01/2011   CHOLHDL 4.2 02/01/2011   Lab Results  Component Value Date   WBC 4.8 10/09/2016   HGB 12.6 10/09/2016   HCT 38.9 10/09/2016   MCV 83.7 10/09/2016   PLT 263 10/09/2016   Attestation Statements:   Reviewed by clinician on day of visit: allergies, medications, problem list, medical history, surgical history, family history, social history, and previous encounter notes.  Sherry Ferguson, CMA, am acting as transcriptionist for [employer], DO.  I have reviewed the above documentation for accuracy and completeness, and I agree with the above. Sherry Ferguson, D.O.  The 21st Century Cures Act was signed into law in 2016 which includes the topic of electronic health records.  This provides immediate access to information in MyChart.  This includes consultation notes, operative notes,  office notes, lab results and pathology reports.  If you have any questions about what you read please let us know at your next visit so we can discuss your concerns and take corrective action if need be.  We are right here with you.

## 2021-10-26 LAB — CBC WITH DIFFERENTIAL/PLATELET
Basophils Absolute: 0 10*3/uL (ref 0.0–0.2)
Basos: 1 %
EOS (ABSOLUTE): 0.3 10*3/uL (ref 0.0–0.4)
Eos: 7 %
Hematocrit: 40.1 % (ref 34.0–46.6)
Hemoglobin: 12.9 g/dL (ref 11.1–15.9)
Immature Grans (Abs): 0 10*3/uL (ref 0.0–0.1)
Immature Granulocytes: 0 %
Lymphocytes Absolute: 1.7 10*3/uL (ref 0.7–3.1)
Lymphs: 41 %
MCH: 26.8 pg (ref 26.6–33.0)
MCHC: 32.2 g/dL (ref 31.5–35.7)
MCV: 83 fL (ref 79–97)
Monocytes Absolute: 0.2 10*3/uL (ref 0.1–0.9)
Monocytes: 5 %
Neutrophils Absolute: 1.8 10*3/uL (ref 1.4–7.0)
Neutrophils: 46 %
Platelets: 318 10*3/uL (ref 150–450)
RBC: 4.81 x10E6/uL (ref 3.77–5.28)
RDW: 13.5 % (ref 11.7–15.4)
WBC: 4 10*3/uL (ref 3.4–10.8)

## 2021-10-26 LAB — COMPREHENSIVE METABOLIC PANEL
ALT: 27 IU/L (ref 0–32)
AST: 17 IU/L (ref 0–40)
Albumin/Globulin Ratio: 1.5 (ref 1.2–2.2)
Albumin: 4.1 g/dL (ref 3.8–4.8)
Alkaline Phosphatase: 82 IU/L (ref 44–121)
BUN/Creatinine Ratio: 13 (ref 9–23)
BUN: 9 mg/dL (ref 6–20)
Bilirubin Total: 0.4 mg/dL (ref 0.0–1.2)
CO2: 22 mmol/L (ref 20–29)
Calcium: 9.2 mg/dL (ref 8.7–10.2)
Chloride: 103 mmol/L (ref 96–106)
Creatinine, Ser: 0.72 mg/dL (ref 0.57–1.00)
Globulin, Total: 2.7 g/dL (ref 1.5–4.5)
Glucose: 88 mg/dL (ref 70–99)
Potassium: 4.3 mmol/L (ref 3.5–5.2)
Sodium: 139 mmol/L (ref 134–144)
Total Protein: 6.8 g/dL (ref 6.0–8.5)
eGFR: 112 mL/min/{1.73_m2} (ref >59)

## 2021-10-26 LAB — LIPID PANEL WITH LDL/HDL RATIO
Cholesterol, Total: 181 mg/dL (ref 100–199)
HDL: 46 mg/dL (ref >39)
LDL Chol Calc (NIH): 115 mg/dL — ABNORMAL HIGH (ref 0–99)
LDL/HDL Ratio: 2.5 ratio (ref 0.0–3.2)
Triglycerides: 108 mg/dL (ref 0–149)
VLDL Cholesterol Cal: 20 mg/dL (ref 5–40)

## 2021-10-26 LAB — TSH: TSH: 1.72 u[IU]/mL (ref 0.450–4.500)

## 2021-10-26 LAB — INSULIN, RANDOM: INSULIN: 9.8 u[IU]/mL (ref 2.6–24.9)

## 2021-10-26 LAB — VITAMIN B12: Vitamin B-12: 754 pg/mL (ref 232–1245)

## 2021-10-26 LAB — VITAMIN D 25 HYDROXY (VIT D DEFICIENCY, FRACTURES): Vit D, 25-Hydroxy: 24.3 ng/mL — ABNORMAL LOW (ref 30.0–100.0)

## 2021-10-26 LAB — T4, FREE: Free T4: 1.18 ng/dL (ref 0.82–1.77)

## 2021-10-26 LAB — FOLATE: Folate: 11.7 ng/mL (ref >3.0)

## 2021-10-26 LAB — HEMOGLOBIN A1C
Est. average glucose Bld gHb Est-mCnc: 126 mg/dL
Hgb A1c MFr Bld: 6 % — ABNORMAL HIGH (ref 4.8–5.6)

## 2021-11-07 ENCOUNTER — Encounter (INDEPENDENT_AMBULATORY_CARE_PROVIDER_SITE_OTHER): Payer: Self-pay

## 2021-11-07 ENCOUNTER — Telehealth (INDEPENDENT_AMBULATORY_CARE_PROVIDER_SITE_OTHER): Payer: 59 | Admitting: Psychology

## 2021-11-08 ENCOUNTER — Encounter (INDEPENDENT_AMBULATORY_CARE_PROVIDER_SITE_OTHER): Payer: Self-pay | Admitting: Family Medicine

## 2021-11-08 ENCOUNTER — Ambulatory Visit (INDEPENDENT_AMBULATORY_CARE_PROVIDER_SITE_OTHER): Payer: 59 | Admitting: Family Medicine

## 2021-11-08 ENCOUNTER — Other Ambulatory Visit: Payer: Self-pay

## 2021-11-08 VITALS — BP 109/73 | HR 80 | Temp 98.0°F | Ht 65.0 in | Wt 298.0 lb

## 2021-11-08 DIAGNOSIS — Z9189 Other specified personal risk factors, not elsewhere classified: Secondary | ICD-10-CM

## 2021-11-08 DIAGNOSIS — E559 Vitamin D deficiency, unspecified: Secondary | ICD-10-CM

## 2021-11-08 DIAGNOSIS — Z6841 Body Mass Index (BMI) 40.0 and over, adult: Secondary | ICD-10-CM

## 2021-11-08 DIAGNOSIS — F39 Unspecified mood [affective] disorder: Secondary | ICD-10-CM | POA: Diagnosis not present

## 2021-11-08 DIAGNOSIS — R7303 Prediabetes: Secondary | ICD-10-CM

## 2021-11-08 DIAGNOSIS — E669 Obesity, unspecified: Secondary | ICD-10-CM

## 2021-11-08 DIAGNOSIS — E7849 Other hyperlipidemia: Secondary | ICD-10-CM

## 2021-11-08 MED ORDER — VITAMIN D (ERGOCALCIFEROL) 1.25 MG (50000 UNIT) PO CAPS: 50000.0000 [IU] | ORAL_CAPSULE | ORAL | 0 refills | Status: DC

## 2021-11-10 NOTE — Progress Notes (Signed)
? ? ? ?Chief Complaint:  ? ?OBESITY ?Sherry Ferguson is here to discuss her progress with her obesity treatment plan along with follow-up of her obesity related diagnoses. Sherry Ferguson is on the Category 2 Plan and states she is following her eating plan approximately 95-98% of the time. Sherry Ferguson states she is not currently exercising. ? ?Today's visit was #: 2 ?Starting weight: 305 lbs ?Starting date: 10/25/2021 ?Today's weight: 298 lbs ?Today's date: 11/08/2021 ?Total lbs lost to date: 7 ?Total lbs lost since last in-office visit: 7 ? ?Interim History: Sherry Ferguson is here today for her first follow-up office visit since starting the program with us. All blood work/ lab tests that were recently ordered by myself or an outside provider were reviewed with patient today per their request. Extended time was spent counseling her on all new disease processes that were discovered or preexisting ones that are affected by BMI. She understands that many of these abnormalities will need to monitored regularly along with the current treatment plan of prudent dietary changes, in which we are making each and every office visit, to improve these health parameters. Sherry NovemberNicole A Ferguson is here for a follow up office visit. We reviewed her meal plan and questions were answered. Patient's food recall appears to be accurate and consistent with what is on plan when she is following it. When eating on plan, her hunger and cravings are well controlled.   ? ?Subjective:  ? ?1. Pre-diabetes ?Sherry Ferguson denies hunger or cravings at this time. She declines the need for medications today. Her A1c is 6.0 and fasting insulin 9.8, not at goal. I discussed labs with the patient today. ? ?2. Other hyperlipidemia ?Sherry Ferguson has a new diagnosis of hyperlipidemia and she is not on medications. She has been trying to improve her cholesterol levels with intensive lifestyle modifications including a low saturated fat diet, exercise, and weight loss. She denies any chest pain,  claudication, or myalgias. I discussed labs with the patient today.  ? ?3. Vitamin D deficiency ?Sherry Ferguson recently started weekly Vit D, only on it for 6 months but hasn't been taking it weekly or regularly at all. I discussed labs with the patient today. ? ?4. Mood disorder (HCC) with emotional eating ?Sherry Ferguson was referred to Dr. Dewaine CongerBarker, but she was "on hold" for 45 minutes and her appointment was never scheduled.  ? ?5. At risk for heart disease ?Sherry Ferguson is at higher than average risk for cardiovascular disease due to obesity, hyperlipidemia, and pre-diabetes. ? ?Assessment/Plan:  ?No orders of the defined types were placed in this encounter. ? ? ?Medications Discontinued During This Encounter  ?Medication Reason  ? amphetamine-dextroamphetamine (ADDERALL) 20 MG tablet   ? FLUoxetine (PROZAC) 10 MG tablet   ? Vitamin D, Ergocalciferol, (DRISDOL) 1.25 MG (50000 UNIT) CAPS capsule Reorder  ?  ? ?Meds ordered this encounter  ?Medications  ? Vitamin D, Ergocalciferol, (DRISDOL) 1.25 MG (50000 UNIT) CAPS capsule  ?  Sig: Take 1 capsule (50,000 Units total) by mouth every 7 (seven) days.  ?  Dispense:  4 capsule  ?  Refill:  0  ?  30 d supply;  ** OV for RF **   Do not send RF request  ?  ? ?1. Pre-diabetes ?Handouts on insulin resistance and pre-diabetes were given to the patient after a long discussed of how food and weight affects this condition. Sherry Ferguson will continue her prudent nutritional plan and weight loss. We will recheck labs in 3-4 months. ? ?2. Other hyperlipidemia ?No  need for statin; lifestyle changes of her prudent nutritional plan with low saturated/trans fats food etc. We will recheck labs in 3-6 months. Eventually increase exercise. ? ?3. Vitamin D deficiency ?We will refill prescription Vitamin D and take as prescribed. We will recheck labs in 3+ months from now. Vit D deficiency counseling was done. ? ?- Vitamin D, Ergocalciferol, (DRISDOL) 1.25 MG (50000 UNIT) CAPS capsule; Take 1 capsule (50,000 Units  total) by mouth every 7 (seven) days.  Dispense: 4 capsule; Refill: 0 ? ?4. Mood disorder (HCC) with emotional eating ?Sherry Ferguson declines to reschedule with Dr. Dewaine Conger. Handouts on emotional eating was given to the patient today. Counseling was done. Symptoms currently well controlled. ? ?5. At risk for heart disease ?Sherry Ferguson was given approximately 23 minutes of coronary artery disease prevention counseling today. She is 36 y.o. female and has risk factors for heart disease including obesity. We discussed intensive lifestyle modifications today with an emphasis on specific weight loss instructions and strategies. ? ?Repetitive spaced learning was employed today to elicit superior memory formation and behavioral change.  ? ?6. Obesity with current BMI of 49.7 ?Sherry Ferguson is currently in the action stage of change. As such, her goal is to continue with weight loss efforts. She has agreed to the Category 2 Plan with lunch options and keeping a food journal and adhering to recommended goals of 300-400 calories and 30+ grams of protein at lunch daily.  ? ?Exercise goals: As is. ? ?Behavioral modification strategies: increasing water intake, emotional eating strategies, and planning for success. ? ?Sherry Ferguson has agreed to follow-up with our clinic in 2 weeks. She was informed of the importance of frequent follow-up visits to maximize her success with intensive lifestyle modifications for her multiple health conditions.  ? ?Objective:  ? ?Blood pressure 109/73, pulse 80, temperature 98 ?F (36.7 ?C), temperature source Oral, height 5\' 5"  (1.651 m), weight 298 lb (135.2 kg), SpO2 98 %. ?Body mass index is 49.59 kg/m?. ? ?General: Cooperative, alert, well developed, in no acute distress. ?HEENT: Conjunctivae and lids unremarkable. ?Cardiovascular: Regular rhythm.  ?Lungs: Normal work of breathing. ?Neurologic: No focal deficits.  ? ?Lab Results  ?Component Value Date  ? CREATININE 0.72 10/25/2021  ? BUN 9 10/25/2021  ? NA 139  10/25/2021  ? K 4.3 10/25/2021  ? CL 103 10/25/2021  ? CO2 22 10/25/2021  ? ?Lab Results  ?Component Value Date  ? ALT 27 10/25/2021  ? AST 17 10/25/2021  ? ALKPHOS 82 10/25/2021  ? BILITOT 0.4 10/25/2021  ? ?Lab Results  ?Component Value Date  ? HGBA1C 6.0 (H) 10/25/2021  ? ?Lab Results  ?Component Value Date  ? INSULIN 9.8 10/25/2021  ? ?Lab Results  ?Component Value Date  ? TSH 1.720 10/25/2021  ? ?Lab Results  ?Component Value Date  ? CHOL 181 10/25/2021  ? HDL 46 10/25/2021  ? LDLCALC 115 (H) 10/25/2021  ? TRIG 108 10/25/2021  ? CHOLHDL 4.2 02/01/2011  ? ?Lab Results  ?Component Value Date  ? VD25OH 24.3 (L) 10/25/2021  ? ?Lab Results  ?Component Value Date  ? WBC 4.0 10/25/2021  ? HGB 12.9 10/25/2021  ? HCT 40.1 10/25/2021  ? MCV 83 10/25/2021  ? PLT 318 10/25/2021  ? ?No results found for: IRON, TIBC, FERRITIN ? ?Attestation Statements:  ? ?Reviewed by clinician on day of visit: allergies, medications, problem list, medical history, surgical history, family history, social history, and previous encounter notes. ? ? ?I, 10/27/2021, am acting as transcriptionist for  Thomasene Lot, DO. ? ?I have reviewed the above documentation for accuracy and completeness, and I agree with the above. Carlye Grippe, D.O. ? ?The 21st Century Cures Act was signed into law in 2016 which includes the topic of electronic health records.  This provides immediate access to information in MyChart.  This includes consultation notes, operative notes, office notes, lab results and pathology reports.  If you have any questions about what you read please let us know at your next visit so we can discuss your concerns and take corrective action if need be.  We are right here with you. ? ? ?

## 2021-11-22 ENCOUNTER — Ambulatory Visit (INDEPENDENT_AMBULATORY_CARE_PROVIDER_SITE_OTHER): Payer: 59 | Admitting: Nurse Practitioner

## 2021-12-07 ENCOUNTER — Ambulatory Visit (INDEPENDENT_AMBULATORY_CARE_PROVIDER_SITE_OTHER): Payer: 59 | Admitting: Family Medicine

## 2022-01-23 ENCOUNTER — Other Ambulatory Visit (INDEPENDENT_AMBULATORY_CARE_PROVIDER_SITE_OTHER): Payer: Self-pay | Admitting: Family Medicine

## 2022-01-23 DIAGNOSIS — E559 Vitamin D deficiency, unspecified: Secondary | ICD-10-CM

## 2022-04-11 ENCOUNTER — Encounter (INDEPENDENT_AMBULATORY_CARE_PROVIDER_SITE_OTHER): Payer: Self-pay

## 2022-06-01 ENCOUNTER — Other Ambulatory Visit: Payer: Self-pay

## 2022-06-01 ENCOUNTER — Emergency Department (HOSPITAL_COMMUNITY): Payer: Commercial Managed Care - PPO

## 2022-06-01 ENCOUNTER — Emergency Department (HOSPITAL_COMMUNITY)
Admission: EM | Admit: 2022-06-01 | Discharge: 2022-06-02 | Discharge status: Against Medical Advice | Payer: Commercial Managed Care - PPO | Attending: Emergency Medicine | Admitting: Emergency Medicine

## 2022-06-01 ENCOUNTER — Encounter (HOSPITAL_COMMUNITY): Payer: Self-pay | Admitting: Emergency Medicine

## 2022-06-01 DIAGNOSIS — Z5321 Procedure and treatment not carried out due to patient leaving prior to being seen by health care provider: Secondary | ICD-10-CM | POA: Insufficient documentation

## 2022-06-01 DIAGNOSIS — N10 Acute tubulo-interstitial nephritis: Secondary | ICD-10-CM | POA: Insufficient documentation

## 2022-06-01 DIAGNOSIS — R1011 Right upper quadrant pain: Secondary | ICD-10-CM | POA: Diagnosis present

## 2022-06-01 LAB — COMPREHENSIVE METABOLIC PANEL
ALT: 23 U/L (ref 0–44)
AST: 18 U/L (ref 15–41)
Albumin: 3.7 g/dL (ref 3.5–5.0)
Alkaline Phosphatase: 63 U/L (ref 38–126)
Anion gap: 7 (ref 5–15)
BUN: 12 mg/dL (ref 6–20)
CO2: 27 mmol/L (ref 22–32)
Calcium: 9.2 mg/dL (ref 8.9–10.3)
Chloride: 107 mmol/L (ref 98–111)
Creatinine, Ser: 0.82 mg/dL (ref 0.44–1.00)
GFR, Estimated: >60 mL/min (ref >60)
Glucose, Bld: 107 mg/dL — ABNORMAL HIGH (ref 70–99)
Potassium: 4 mmol/L (ref 3.5–5.1)
Sodium: 141 mmol/L (ref 135–145)
Total Bilirubin: 0.4 mg/dL (ref 0.3–1.2)
Total Protein: 6.9 g/dL (ref 6.5–8.1)

## 2022-06-01 LAB — CBC WITH DIFFERENTIAL/PLATELET
Abs Immature Granulocytes: 0.01 10*3/uL (ref 0.00–0.07)
Basophils Absolute: 0 10*3/uL (ref 0.0–0.1)
Basophils Relative: 0 %
Eosinophils Absolute: 0.4 10*3/uL (ref 0.0–0.5)
Eosinophils Relative: 6 %
HCT: 38.6 % (ref 36.0–46.0)
Hemoglobin: 12.4 g/dL (ref 12.0–15.0)
Immature Granulocytes: 0 %
Lymphocytes Relative: 39 %
Lymphs Abs: 2.3 10*3/uL (ref 0.7–4.0)
MCH: 27.8 pg (ref 26.0–34.0)
MCHC: 32.1 g/dL (ref 30.0–36.0)
MCV: 86.5 fL (ref 80.0–100.0)
Monocytes Absolute: 0.5 10*3/uL (ref 0.1–1.0)
Monocytes Relative: 8 %
Neutro Abs: 2.8 10*3/uL (ref 1.7–7.7)
Neutrophils Relative %: 47 %
Platelets: 285 10*3/uL (ref 150–400)
RBC: 4.46 MIL/uL (ref 3.87–5.11)
RDW: 14.3 % (ref 11.5–15.5)
WBC: 6 10*3/uL (ref 4.0–10.5)
nRBC: 0 % (ref 0.0–0.2)

## 2022-06-01 LAB — URINALYSIS, ROUTINE W REFLEX MICROSCOPIC
Bilirubin Urine: NEGATIVE
Glucose, UA: NEGATIVE mg/dL
Ketones, ur: NEGATIVE mg/dL
Nitrite: NEGATIVE
Protein, ur: NEGATIVE mg/dL
Specific Gravity, Urine: 1.017 (ref 1.005–1.030)
pH: 6 (ref 5.0–8.0)

## 2022-06-01 LAB — LIPASE, BLOOD: Lipase: 34 U/L (ref 11–51)

## 2022-06-01 LAB — I-STAT BETA HCG BLOOD, ED (MC, WL, AP ONLY): I-stat hCG, quantitative: <5 m[IU]/mL (ref <5)

## 2022-06-01 MED ORDER — HYDROCODONE-ACETAMINOPHEN 5-325 MG PO TABS: 1.0000 | ORAL_TABLET | Freq: Once | ORAL | Status: DC

## 2022-06-01 MED ORDER — ONDANSETRON 4 MG PO TBDP: 4.0000 mg | ORAL_TABLET | Freq: Once | ORAL | Status: DC

## 2022-06-01 NOTE — ED Notes (Signed)
CALLED PATIENT X3 NO ANSWER.

## 2022-06-01 NOTE — ED Triage Notes (Signed)
Pt c/o RUQ pain that radiates to her back x 3 days. Denies urinary symptoms, nausea or vomiting. Pain worse on palpation.

## 2022-06-01 NOTE — ED Provider Triage Note (Signed)
Emergency Medicine Provider Triage Evaluation Note  Sherry Ferguson , a 36 y.o. female  was evaluated in triage.  Pt complains of RUQ abd pain on and off for months. Now constant for 3 days. Radiates to back. No vomiting  Review of Systems  Positive: RUQ pain Negative: fever  Physical Exam  BP 124/87 (BP Location: Right Arm)   Pulse 76   Temp 97.7 F (36.5 C) (Oral)   Resp 18   SpO2 99%  Gen:   Awake, no distress   Resp:  Normal effort  MSK:   Moves extremities without difficulty  Other:  Ttp ruq  Medical Decision Making  Medically screening exam initiated at 7:29 PM.  Appropriate orders placed.  TANICA GAIGE was informed that the remainder of the evaluation will be completed by another provider, this initial triage assessment does not replace that evaluation, and the importance of remaining in the ED until their evaluation is complete.  Work up initiated   Margarita Mail, [place] 06/01/22 1931

## 2022-06-02 ENCOUNTER — Emergency Department (HOSPITAL_BASED_OUTPATIENT_CLINIC_OR_DEPARTMENT_OTHER)
Admission: EM | Admit: 2022-06-02 | Discharge: 2022-06-02 | Disposition: A | Discharge status: Home / Self Care | Payer: Commercial Managed Care - PPO | Source: Home / Self Care | Attending: Emergency Medicine | Admitting: Emergency Medicine

## 2022-06-02 ENCOUNTER — Encounter (HOSPITAL_BASED_OUTPATIENT_CLINIC_OR_DEPARTMENT_OTHER): Payer: Self-pay | Admitting: Emergency Medicine

## 2022-06-02 ENCOUNTER — Emergency Department (HOSPITAL_BASED_OUTPATIENT_CLINIC_OR_DEPARTMENT_OTHER): Payer: Commercial Managed Care - PPO

## 2022-06-02 DIAGNOSIS — N10 Acute tubulo-interstitial nephritis: Secondary | ICD-10-CM

## 2022-06-02 DIAGNOSIS — K5900 Constipation, unspecified: Secondary | ICD-10-CM

## 2022-06-02 LAB — URINALYSIS, ROUTINE W REFLEX MICROSCOPIC
Bilirubin Urine: NEGATIVE
Glucose, UA: NEGATIVE mg/dL
Ketones, ur: NEGATIVE mg/dL
Leukocytes,Ua: NEGATIVE
Nitrite: NEGATIVE
Protein, ur: NEGATIVE mg/dL
Specific Gravity, Urine: 1.025 (ref 1.005–1.030)
pH: 5.5 (ref 5.0–8.0)

## 2022-06-02 LAB — COMPREHENSIVE METABOLIC PANEL
ALT: 22 U/L (ref 0–44)
AST: 18 U/L (ref 15–41)
Albumin: 3.7 g/dL (ref 3.5–5.0)
Alkaline Phosphatase: 67 U/L (ref 38–126)
Anion gap: 5 (ref 5–15)
BUN: 13 mg/dL (ref 6–20)
CO2: 25 mmol/L (ref 22–32)
Calcium: 8.6 mg/dL — ABNORMAL LOW (ref 8.9–10.3)
Chloride: 110 mmol/L (ref 98–111)
Creatinine, Ser: 0.64 mg/dL (ref 0.44–1.00)
GFR, Estimated: >60 mL/min (ref >60)
Glucose, Bld: 104 mg/dL — ABNORMAL HIGH (ref 70–99)
Potassium: 3.8 mmol/L (ref 3.5–5.1)
Sodium: 140 mmol/L (ref 135–145)
Total Bilirubin: 0.5 mg/dL (ref 0.3–1.2)
Total Protein: 7.4 g/dL (ref 6.5–8.1)

## 2022-06-02 LAB — URINALYSIS, MICROSCOPIC (REFLEX)

## 2022-06-02 LAB — CBC
HCT: 39.3 % (ref 36.0–46.0)
Hemoglobin: 12.3 g/dL (ref 12.0–15.0)
MCH: 26.7 pg (ref 26.0–34.0)
MCHC: 31.3 g/dL (ref 30.0–36.0)
MCV: 85.2 fL (ref 80.0–100.0)
Platelets: 289 10*3/uL (ref 150–400)
RBC: 4.61 MIL/uL (ref 3.87–5.11)
RDW: 14.4 % (ref 11.5–15.5)
WBC: 6.1 10*3/uL (ref 4.0–10.5)
nRBC: 0 % (ref 0.0–0.2)

## 2022-06-02 LAB — LIPASE, BLOOD: Lipase: 35 U/L (ref 11–51)

## 2022-06-02 LAB — PREGNANCY, URINE: Preg Test, Ur: NEGATIVE

## 2022-06-02 MED ORDER — MORPHINE SULFATE (PF) 4 MG/ML IV SOLN
4.0000 mg | Freq: Once | INTRAVENOUS | Status: AC
  Administered 2022-06-02: 4 mg via INTRAVENOUS
  Filled 2022-06-02: qty 1

## 2022-06-02 MED ORDER — SODIUM CHLORIDE 0.9 % IV BOLUS
1000.0000 mL | Freq: Once | INTRAVENOUS | Status: AC
  Administered 2022-06-02: 1000 mL via INTRAVENOUS

## 2022-06-02 MED ORDER — SODIUM CHLORIDE 0.9 % IV SOLN
1.0000 g | Freq: Once | INTRAVENOUS | Status: AC
  Administered 2022-06-02: 1 g via INTRAVENOUS
  Filled 2022-06-02: qty 10

## 2022-06-02 MED ORDER — ONDANSETRON HCL 4 MG/2ML IJ SOLN
4.0000 mg | Freq: Once | INTRAMUSCULAR | Status: AC
  Administered 2022-06-02: 4 mg via INTRAVENOUS
  Filled 2022-06-02: qty 2

## 2022-06-02 MED ORDER — CEPHALEXIN 500 MG PO CAPS: 500.0000 mg | ORAL_CAPSULE | Freq: Three times a day (TID) | ORAL | 0 refills | Status: DC

## 2022-06-02 MED ORDER — IOHEXOL 350 MG/ML SOLN
100.0000 mL | Freq: Once | INTRAVENOUS | Status: AC | PRN
  Administered 2022-06-02: 100 mL via INTRAVENOUS

## 2022-06-02 MED ORDER — HYDROMORPHONE HCL 1 MG/ML IJ SOLN
1.0000 mg | Freq: Once | INTRAMUSCULAR | Status: AC
  Administered 2022-06-02: 1 mg via INTRAVENOUS
  Filled 2022-06-02: qty 1

## 2022-06-02 MED ORDER — OXYCODONE-ACETAMINOPHEN 5-325 MG PO TABS: 1.0000 | ORAL_TABLET | Freq: Four times a day (QID) | ORAL | 0 refills | Status: DC | PRN

## 2022-06-02 NOTE — ED Triage Notes (Addendum)
Pt reports RUQ with radiation to RT side back x 5 days; +nausea, no vomiting; was seen at [facility] last night, but LWBS (labs and [place] completed)

## 2022-06-02 NOTE — ED Notes (Signed)
Patient transported to CT 

## 2022-06-02 NOTE — Discharge Instructions (Addendum)
You likely have kidney infection.   Take tylenol, motrin for pain   Take percocet for severe pain.   Take miralax daily for constipation   Take keflex three times daily for a week   See your doctor   Return to ER if you have uncontrolled pain, vomiting, fever

## 2022-06-02 NOTE — ED Notes (Signed)
Late entry --pt has returned from [place] via stretcher; IVF bolus resumed.  Pt reporting pain had improved to 3/10 after receiving Morphine but now back up to 7-8/10.  Nausea controlled per patient.  Dr Darl Householder notified via secure chat and aware pt also has asked for addl pain med.

## 2022-06-02 NOTE — ED Notes (Signed)
Pt agreeable with d/c plan as discussed by provider - this nurse has verbally reinforced d/c instructions and provided pt with written copy - pt acknowledges verbal understanding and denies any addl questions concerns needs- escorted to exit via w/c by this nurse; accompanied/driven home by spouse

## 2022-06-02 NOTE — ED Provider Notes (Signed)
[place] EMERGENCY DEPARTMENT Provider Note   CSN: [id number] Arrival date & time: 06/02/22  1803     History  Chief Complaint  Patient presents with   Abdominal Pain    SILVANNA OHMER is a 36 y.o. female here presenting with right upper quadrant and right flank pain.  Patient states that her pain has been going on for the last several weeks.  She states that yesterday got worse.  [facility] had a unremarkable right upper quadrant ultrasound.  Patient does have some mild dysuria and frequency as well.  Denies any blood in her urine.  She has some nausea vomiting as well.  She states that the pain got worse today so she came here for further evaluation.  The history is provided by the patient.       Home Medications Prior to Admission medications   Medication Sig Start Date End Date Taking? Authorizing Provider  Acetaminophen (TYLENOL EXTRA STRENGTH PO) Take 1,000 mg by mouth as needed.    [provider]  Aspirin-Acetaminophen-Caffeine (GOODYS EXTRA STRENGTH PO) Take by mouth. Every 6 hrs as needed    [provider]  Aspirin-Salicylamide-Caffeine (BC HEADACHE POWDER PO) Take by mouth. Every 6 hrs as needed    [provider]  budesonide-formoterol (SYMBICORT) 160-4.5 MCG/ACT inhaler Inhale 2 puffs into the lungs 2 (two) times daily. 07/19/16   Golden Circle, FNP  Vitamin D, Ergocalciferol, (DRISDOL) 1.25 MG (50000 UNIT) CAPS capsule Take 1 capsule (50,000 Units total) by mouth every 7 (seven) days. 11/08/21   Mellody Dance, DO      Allergies    Patient has no known allergies.    Review of Systems   Review of Systems  Gastrointestinal:  Positive for abdominal pain and vomiting.  All other systems reviewed and are negative.   Physical Exam Updated Vital Signs BP (!) 137/100   Pulse 82   Temp 98.2 F (36.8 C) (Oral)   Resp 16   Ht 5\' 5"  (1.651 m)   Wt (!) 138.3 kg   LMP 05/09/2022 Comment:  negative pregnancy test  SpO2 99%   BMI 50.75 kg/m  Physical Exam Vitals and nursing note reviewed.  Constitutional:      Comments: Uncomfortable  HENT:     Head: Normocephalic.     Mouth/Throat:     Mouth: Mucous membranes are moist.  Eyes:     Extraocular Movements: Extraocular movements intact.     Pupils: Pupils are equal, round, and reactive to light.  Cardiovascular:     Rate and Rhythm: Normal rate and regular rhythm.     Heart sounds: Normal heart sounds.  Pulmonary:     Effort: Pulmonary effort is normal.     Breath sounds: Normal breath sounds.  Abdominal:     General: Abdomen is flat.     Comments: Mild right upper quadrant and right CVA tenderness.  Skin:    General: Skin is warm.     Capillary Refill: Capillary refill takes less than 2 seconds.  Neurological:     General: No focal deficit present.     Mental Status: She is oriented to person, place, and time.  Psychiatric:        Mood and Affect: Mood normal.        Behavior: Behavior normal.     ED Results / Procedures / Treatments   Labs (all labs ordered are listed, but only abnormal results are displayed) Labs Reviewed  COMPREHENSIVE METABOLIC PANEL - Abnormal; Notable for the following components:      Result Value   Glucose, Bld 104 (*)    Calcium 8.6 (*)    All other components within normal limits  URINALYSIS, ROUTINE W REFLEX MICROSCOPIC - Abnormal; Notable for the following components:   Hgb urine dipstick SMALL (*)    All other components within normal limits  URINALYSIS, MICROSCOPIC (REFLEX) - Abnormal; Notable for the following components:   Bacteria, UA MANY (*)    All other components within normal limits  LIPASE, BLOOD  PREGNANCY, URINE  CBC    EKG None  Radiology CT ABDOMEN PELVIS W CONTRAST  Result Date: 06/02/2022 CLINICAL DATA:  Right upper quadrant pain radiating to the back with nausea EXAM: CT ABDOMEN AND PELVIS WITH CONTRAST TECHNIQUE: Multidetector CT imaging of the  abdomen and pelvis was performed using the standard protocol following bolus administration of intravenous contrast. RADIATION DOSE REDUCTION: This exam was performed according to the departmental dose-optimization program which includes automated exposure control, adjustment of the mA and/or kV according to patient size and/or use of iterative reconstruction technique. CONTRAST:  OMNIPAQUE IOHEXOL 350 MG/ML SOLN COMPARISON:  Abdominal ultrasound 06/01/2022 FINDINGS: Lower chest: No acute abnormality. Hepatobiliary: Hepatic steatosis. No suspicious focal hepatic lesion. Unremarkable gallbladder. No biliary ductal dilation. Pancreas: Unremarkable. No pancreatic ductal dilatation or surrounding inflammatory changes. Spleen: Normal in size without focal abnormality. Adrenals/Urinary Tract: Unremarkable adrenal glands. No urinary calculi or hydronephrosis. Unremarkable bladder. Stomach/Bowel: Stomach is unremarkable. Normal caliber large and small bowel. Normal appendix. There is fecalization of the ileum with moderate stool burden in the ascending colon. Vascular/Lymphatic: No significant vascular findings are present. No enlarged abdominal or pelvic lymph nodes. Reproductive: Uterus and bilateral adnexa are unremarkable. Other: No free intraperitoneal fluid or air. Musculoskeletal: No acute or significant osseous findings. IMPRESSION: Fecalization of the terminal ileum suggestive of slow transit. No evidence of obstruction. Hepatic steatosis. Electronically Signed   By: Minerva Fester M.D.   On: 06/02/2022 22:27   US ABDOMEN LIMITED RUQ (LIVER/GB)  Result Date: 06/01/2022 CLINICAL DATA:  Bilious emesis EXAM: ULTRASOUND ABDOMEN LIMITED RIGHT UPPER QUADRANT COMPARISON:  None Available. FINDINGS: Gallbladder: Gallbladder is contracted, limiting its evaluation. No evidence of shadowing gallstones or acute cholecystitis. Negative sonographic Murphy sign. Common bile duct: Diameter: 3 mm Liver: Diffuse increased  liver echotexture most compatible with hepatic steatosis. No focal abnormality or biliary duct dilation. Portal vein is patent on color Doppler imaging with normal direction of blood flow towards the liver. Other: None. IMPRESSION: 1. Limited visualization of the gallbladder due to decompressed state. No sonographic evidence of cholelithiasis or cholecystitis. 2. Increased liver echotexture consistent with hepatic steatosis. Electronically Signed   By: Sharlet Salina M.D.   On: 06/01/2022 20:42    Procedures Procedures    Medications Ordered in ED Medications  HYDROmorphone (DILAUDID) injection 1 mg (has no administration in time range)  cefTRIAXone (ROCEPHIN) 1 g in sodium chloride 0.9 % 100 mL IVPB (has no administration in time range)  sodium chloride 0.9 % bolus 1,000 mL (1,000 mLs Intravenous New Bag/Given 06/02/22 2147)  ondansetron (ZOFRAN) injection 4 mg (4 mg Intravenous Given 06/02/22 2149)  morphine (PF) 4 MG/ML injection 4 mg (4 mg Intravenous Given 06/02/22 2147)  iohexol (OMNIPAQUE) 350 MG/ML injection 100 mL (100 mLs Intravenous Contrast Given 06/02/22 2157)    ED Course/ Medical Decision Making/ A&P  Medical Decision Making DALEIZA BACCHI is a 36 y.o. female here presenting with right flank pain and right upper quadrant pain.  Patient is very uncomfortable.  Had negative right upper quadrant ultrasound yesterday.  Consider renal colic versus pyelonephritis versus gastroenteritis.  Plan to get CBC and CMP and CT abdomen pelvis and urinalysis.  10:56 PM I reviewed patient's labs and independently interpreted imaging studies.  UA showed many bacteria.  CT abdomen pelvis showed mild constipation but no obstruction.  Patient given antibiotics and pain medicine and Zofran and felt better.  I wonder if she has mild pyelonephritis causing her symptoms.  Patient will be discharged home with pain medicine and antibiotics.   Problems Addressed: Acute  pyelonephritis: acute illness or injury  Amount and/or Complexity of Data Reviewed Labs: ordered. Decision-making details documented in ED Course. Radiology: ordered and independent interpretation performed. Decision-making details documented in ED Course.  Risk Prescription drug management.    Final Clinical Impression(s) / ED Diagnoses Final diagnoses:  None    Rx / DC Orders ED Discharge Orders     None         Charlynne Pander, MD 06/02/22 2309

## 2022-07-17 ENCOUNTER — Emergency Department (HOSPITAL_BASED_OUTPATIENT_CLINIC_OR_DEPARTMENT_OTHER): Payer: Commercial Managed Care - PPO

## 2022-07-17 ENCOUNTER — Encounter (HOSPITAL_BASED_OUTPATIENT_CLINIC_OR_DEPARTMENT_OTHER): Payer: Self-pay | Admitting: Emergency Medicine

## 2022-07-17 ENCOUNTER — Other Ambulatory Visit: Payer: Self-pay

## 2022-07-17 ENCOUNTER — Emergency Department (HOSPITAL_BASED_OUTPATIENT_CLINIC_OR_DEPARTMENT_OTHER)
Admission: EM | Admit: 2022-07-17 | Discharge: 2022-07-17 | Disposition: A | Discharge status: Home / Self Care | Payer: Commercial Managed Care - PPO | Attending: Emergency Medicine | Admitting: Emergency Medicine

## 2022-07-17 DIAGNOSIS — O200 Threatened abortion: Secondary | ICD-10-CM | POA: Insufficient documentation

## 2022-07-17 DIAGNOSIS — J45909 Unspecified asthma, uncomplicated: Secondary | ICD-10-CM | POA: Insufficient documentation

## 2022-07-17 DIAGNOSIS — Z7982 Long term (current) use of aspirin: Secondary | ICD-10-CM | POA: Diagnosis not present

## 2022-07-17 DIAGNOSIS — R1030 Lower abdominal pain, unspecified: Secondary | ICD-10-CM | POA: Diagnosis not present

## 2022-07-17 DIAGNOSIS — Z7951 Long term (current) use of inhaled steroids: Secondary | ICD-10-CM | POA: Diagnosis not present

## 2022-07-17 DIAGNOSIS — O039 Complete or unspecified spontaneous abortion without complication: Secondary | ICD-10-CM

## 2022-07-17 DIAGNOSIS — N939 Abnormal uterine and vaginal bleeding, unspecified: Secondary | ICD-10-CM | POA: Insufficient documentation

## 2022-07-17 LAB — COMPREHENSIVE METABOLIC PANEL
ALT: 20 U/L (ref 0–44)
AST: 13 U/L — ABNORMAL LOW (ref 15–41)
Albumin: 4.3 g/dL (ref 3.5–5.0)
Alkaline Phosphatase: 63 U/L (ref 38–126)
Anion gap: 11 (ref 5–15)
BUN: 13 mg/dL (ref 6–20)
CO2: 24 mmol/L (ref 22–32)
Calcium: 9.7 mg/dL (ref 8.9–10.3)
Chloride: 105 mmol/L (ref 98–111)
Creatinine, Ser: 0.7 mg/dL (ref 0.44–1.00)
GFR, Estimated: >60 mL/min (ref >60)
Glucose, Bld: 98 mg/dL (ref 70–99)
Potassium: 3.8 mmol/L (ref 3.5–5.1)
Sodium: 140 mmol/L (ref 135–145)
Total Bilirubin: 0.4 mg/dL (ref 0.3–1.2)
Total Protein: 7.4 g/dL (ref 6.5–8.1)

## 2022-07-17 LAB — URINALYSIS, ROUTINE W REFLEX MICROSCOPIC
Bilirubin Urine: NEGATIVE
Glucose, UA: NEGATIVE mg/dL
Nitrite: NEGATIVE
RBC / HPF: >50 RBC/hpf — ABNORMAL HIGH (ref 0–5)
Specific Gravity, Urine: 1.031 — ABNORMAL HIGH (ref 1.005–1.030)
pH: 6 (ref 5.0–8.0)

## 2022-07-17 LAB — ABO/RH: ABO/RH(D): B POS

## 2022-07-17 LAB — CBC
HCT: 40.3 % (ref 36.0–46.0)
Hemoglobin: 12.8 g/dL (ref 12.0–15.0)
MCH: 27.5 pg (ref 26.0–34.0)
MCHC: 31.8 g/dL (ref 30.0–36.0)
MCV: 86.7 fL (ref 80.0–100.0)
Platelets: 281 10*3/uL (ref 150–400)
RBC: 4.65 MIL/uL (ref 3.87–5.11)
RDW: 14.9 % (ref 11.5–15.5)
WBC: 7.3 10*3/uL (ref 4.0–10.5)
nRBC: 0 % (ref 0.0–0.2)

## 2022-07-17 LAB — HCG, QUANTITATIVE, PREGNANCY: hCG, Beta Chain, Quant, S: 1252 m[IU]/mL — ABNORMAL HIGH (ref <5)

## 2022-07-17 LAB — PREGNANCY, URINE: Preg Test, Ur: POSITIVE — AB

## 2022-07-17 MED ORDER — ONDANSETRON 4 MG PO TBDP: 4.0000 mg | ORAL_TABLET | Freq: Three times a day (TID) | ORAL | 0 refills | Status: DC | PRN

## 2022-07-17 NOTE — ED Triage Notes (Signed)
Pt arrived POV, caox4, ambulatory. Pt reports she found out 1 week ago at her PCP that she was pregnant and Sunday night she started to experience a little bit of spotting but vaginal bleeding became heavier with and has been passing clots with lower abdominal cramping since yesterday.

## 2022-07-17 NOTE — ED Notes (Signed)
IV established, discussed with pt about ectopic pregnancy and plan of care and OB consult. Pt verbalized understanding and is calm but tearful. Sts husband is on the way. Pt denies needs right now, asked for water but explained NPO status.

## 2022-07-17 NOTE — ED Provider Notes (Signed)
[facility] EMERGENCY DEPT Provider Note   CSN: [id number] Arrival date & time: 07/17/22  1704     History {Add pertinent medical, surgical, social history, OB history to HPI:1} Chief Complaint  Patient presents with   Threatened Miscarriage    Sherry Ferguson is a 36 y.o. female.  HPI Patient is a 36 year old female with past medical history significant for obesity asthma and depression  She is present emergency room today with complaints of vaginal bleeding since yesterday evening.  She states that 11/7 she was told by her PCP that she had a positive pregnancy test.  She has a hCG quantitative result that was greater than 2000.  She states that yesterday and today she is also has some lower abdominal pain.  She states that her bleeding became more pronounced today.      Home Medications Prior to Admission medications   Medication Sig Start Date End Date Taking? Authorizing Provider  Acetaminophen (TYLENOL EXTRA STRENGTH PO) Take 1,000 mg by mouth as needed.    [provider]  Aspirin-Acetaminophen-Caffeine (GOODYS EXTRA STRENGTH PO) Take by mouth. Every 6 hrs as needed    [provider]  Aspirin-Salicylamide-Caffeine (BC HEADACHE POWDER PO) Take by mouth. Every 6 hrs as needed    [provider]  budesonide-formoterol (SYMBICORT) 160-4.5 MCG/ACT inhaler Inhale 2 puffs into the lungs 2 (two) times daily. 07/19/16   Veryl Speak, FNP  cephALEXin (KEFLEX) 500 MG capsule Take 1 capsule (500 mg total) by mouth 3 (three) times daily. 06/02/22   Charlynne Pander, MD  oxyCODONE-acetaminophen (PERCOCET) 5-325 MG tablet Take 1 tablet by mouth every 6 (six) hours as needed. 06/02/22   Charlynne Pander, MD  Vitamin D, Ergocalciferol, (DRISDOL) 1.25 MG (50000 UNIT) CAPS capsule Take 1 capsule (50,000 Units total) by mouth every 7 (seven) days. 11/08/21   Thomasene Lot, DO      Allergies    Patient has no known allergies.    Review of  Systems   Review of Systems  Physical Exam Updated Vital Signs BP (!) 116/98 (BP Location: Left Arm)   Pulse 89   Temp 97.8 F (36.6 C) (Oral)   Resp 18   Ht 5\' 5"  (1.651 m)   Wt 136.1 kg   LMP 05/23/2022 (Approximate) Comment: Abnormal cycles usually  SpO2 100%   BMI 49.92 kg/m  Physical Exam  ED Results / Procedures / Treatments   Labs (all labs ordered are listed, but only abnormal results are displayed) Labs Reviewed  PREGNANCY, URINE - Abnormal; Notable for the following components:      Result Value   Preg Test, Ur POSITIVE (*)    All other components within normal limits  COMPREHENSIVE METABOLIC PANEL - Abnormal; Notable for the following components:   AST 13 (*)    All other components within normal limits  HCG, QUANTITATIVE, PREGNANCY - Abnormal; Notable for the following components:   hCG, Beta Chain, Quant, S 1,252 (*)    All other components within normal limits  URINALYSIS, ROUTINE W REFLEX MICROSCOPIC - Abnormal; Notable for the following components:   Color, Urine ORANGE (*)    APPearance HAZY (*)    Specific Gravity, Urine 1.031 (*)    Hgb urine dipstick LARGE (*)    Ketones, ur TRACE (*)    Protein, ur TRACE (*)    Leukocytes,Ua SMALL (*)    RBC / HPF >50 (*)    All other components within normal limits  CBC  ABO/RH    EKG None    Radiology No results found.  Procedures Procedures  {Document cardiac monitor, telemetry assessment procedure when appropriate:1}  Medications Ordered in ED Medications - No data to display  ED Course/ Medical Decision Making/ A&P Clinical Course as of 07/17/22 1939  Tue Jul 17, 2022  1848 1 week ago + preg test  Yesterday and today lower abd pain, bleeding PV began Sunday evening (spotting). Bleeding became more pronounced today. Used two pads over the course of the day.  [WF]    Clinical Course User Index [WF] Gailen Shelter, [place]                           Medical Decision Making Amount and/or  Complexity of Data Reviewed Labs: ordered.   This patient presents to the ED for concern of ***, this involves a number of treatment options, and is a complaint that carries with it a *** risk of complications and morbidity. A differential diagnosis was considered for the patient's symptoms which is discussed below:   ***   Co morbidities: Discussed in HPI   Brief History:  ***    EMR reviewed including pt PMHx, past surgical history and past visits to ER.   See HPI for more details   Lab Tests:   {Blank single:19197::"I ordered and independently interpreted labs. Labs notable for","I personally reviewed all laboratory work and imaging. Metabolic panel without any acute abnormality specifically kidney function within normal limits and no significant electrolyte abnormalities. CBC without leukocytosis or significant anemia."}   Imaging Studies:  {Blank single:19197::"NAD. I personally reviewed all imaging studies and no acute abnormality found. I agree with radiology interpretation.","Abnormal findings. I personally reviewed all imaging studies. Imaging notable for","No imaging studies ordered for this patient"}    Cardiac Monitoring:  {Blank single:19197::"The patient was maintained on a cardiac monitor.  I personally viewed and interpreted the cardiac monitored which showed an underlying rhythm of:","NA"} {Blank single:19197::"EKG non-ischemic","NA"}   Medicines ordered:  I ordered medication including ***  for *** Reevaluation of the patient after these medicines showed that the patient {resolved/improved/worsened:23923::"improved"} I have reviewed the patients home medicines and have made adjustments as needed   Critical Interventions:  ***   Consults/Attending Physician   {Blank single:19197::"I requested consultation with ***,  and discussed lab and imaging findings as well as pertinent plan - they recommend: ***","I discussed this case with my attending  physician who cosigned this note including patient's presenting symptoms, physical exam, and planned diagnostics and interventions. Attending physician stated agreement with plan or made changes to plan which were implemented."}   Reevaluation:  After the interventions noted above I re-evaluated patient and found that they have :{resolved/improved/worsened:23923::"improved"}   Social Determinants of Health:  {Blank single:19197::"Given cab voucher","Social work/case management involved","The patient's social determinants of health were a factor in the care of this patient"}    Problem List / ED Course:  ***   Dispostion:  After consideration of the diagnostic results and the patients response to treatment, I feel that the patent would benefit from ***     Final Clinical Impression(s) / ED Diagnoses Final diagnoses:  None    Rx / DC Orders ED Discharge Orders     None

## 2022-07-17 NOTE — Discharge Instructions (Addendum)
Please follow-up with your OB/GYN in 1 week.  If you are not able to see your OB/GYN this noon please follow-up with our OB/GYN I have given you their phone number.

## 2022-08-03 ENCOUNTER — Other Ambulatory Visit: Payer: Self-pay | Admitting: Obstetrics and Gynecology

## 2023-03-30 ENCOUNTER — Inpatient Hospital Stay (HOSPITAL_COMMUNITY)
Admission: AD | Admit: 2023-03-30 | Discharge: 2023-03-31 | Disposition: A | Discharge status: Home / Self Care | Payer: Commercial Managed Care - PPO | Attending: Obstetrics and Gynecology | Admitting: Obstetrics and Gynecology

## 2023-03-30 ENCOUNTER — Encounter (HOSPITAL_COMMUNITY): Payer: Self-pay | Admitting: *Deleted

## 2023-03-30 DIAGNOSIS — Z3A10 10 weeks gestation of pregnancy: Secondary | ICD-10-CM | POA: Insufficient documentation

## 2023-03-30 DIAGNOSIS — O26891 Other specified pregnancy related conditions, first trimester: Secondary | ICD-10-CM | POA: Insufficient documentation

## 2023-03-30 DIAGNOSIS — Z7951 Long term (current) use of inhaled steroids: Secondary | ICD-10-CM | POA: Insufficient documentation

## 2023-03-30 DIAGNOSIS — O209 Hemorrhage in early pregnancy, unspecified: Secondary | ICD-10-CM | POA: Insufficient documentation

## 2023-03-30 DIAGNOSIS — O039 Complete or unspecified spontaneous abortion without complication: Secondary | ICD-10-CM | POA: Diagnosis not present

## 2023-03-30 DIAGNOSIS — Z87891 Personal history of nicotine dependence: Secondary | ICD-10-CM | POA: Insufficient documentation

## 2023-03-30 DIAGNOSIS — O09521 Supervision of elderly multigravida, first trimester: Secondary | ICD-10-CM | POA: Diagnosis present

## 2023-03-30 LAB — CBC
HCT: 37.1 % (ref 36.0–46.0)
Hemoglobin: 12.1 g/dL (ref 12.0–15.0)
MCH: 28.2 pg (ref 26.0–34.0)
MCHC: 32.6 g/dL (ref 30.0–36.0)
MCV: 86.5 fL (ref 80.0–100.0)
Platelets: 258 10*3/uL (ref 150–400)
RBC: 4.29 MIL/uL (ref 3.87–5.11)
RDW: 15.4 % (ref 11.5–15.5)
WBC: 8.4 10*3/uL (ref 4.0–10.5)
nRBC: 0 % (ref 0.0–0.2)

## 2023-03-30 NOTE — MAU Note (Addendum)
.  Sherry Ferguson is a 37 y.o. at [redacted]w[redacted]d here in MAU reporting: she started having some vag bleeding and cramping this morning. It has continued . Changed pad 3 times since 5pm. Went to OB yesterday and had f/u u/s was told the baby was they same size as it was 2 weeks ago. Also had Sub chorionic hemorrhage  2 weeks ago.  LMP: 01/19/2023 Onset of complaint: this morning Pain score: 7-9 Vitals:   03/30/23 2259  BP: 112/82  Pulse: 88  Resp: 18  Temp: 98 F (36.7 C)     FHT:n/a Lab orders placed from triage:

## 2023-03-31 ENCOUNTER — Inpatient Hospital Stay (HOSPITAL_COMMUNITY): Payer: Commercial Managed Care - PPO

## 2023-03-31 DIAGNOSIS — Z3A10 10 weeks gestation of pregnancy: Secondary | ICD-10-CM

## 2023-03-31 DIAGNOSIS — O039 Complete or unspecified spontaneous abortion without complication: Secondary | ICD-10-CM

## 2023-03-31 LAB — COMPREHENSIVE METABOLIC PANEL WITH GFR
ALT: 18 U/L (ref 0–44)
AST: 17 U/L (ref 15–41)
Albumin: 3.3 g/dL — ABNORMAL LOW (ref 3.5–5.0)
Alkaline Phosphatase: 60 U/L (ref 38–126)
Anion gap: 9 (ref 5–15)
BUN: 11 mg/dL (ref 6–20)
CO2: 21 mmol/L — ABNORMAL LOW (ref 22–32)
Calcium: 8.8 mg/dL — ABNORMAL LOW (ref 8.9–10.3)
Chloride: 107 mmol/L (ref 98–111)
Creatinine, Ser: 0.77 mg/dL (ref 0.44–1.00)
GFR, Estimated: >60 mL/min (ref >60)
Glucose, Bld: 103 mg/dL — ABNORMAL HIGH (ref 70–99)
Potassium: 4 mmol/L (ref 3.5–5.1)
Sodium: 137 mmol/L (ref 135–145)
Total Bilirubin: 0.5 mg/dL (ref 0.3–1.2)
Total Protein: 6.6 g/dL (ref 6.5–8.1)

## 2023-03-31 LAB — WET PREP, GENITAL
Clue Cells Wet Prep HPF POC: NONE SEEN
Sperm: NONE SEEN
Trich, Wet Prep: NONE SEEN
WBC, Wet Prep HPF POC: <10 (ref <10)
Yeast Wet Prep HPF POC: NONE SEEN

## 2023-03-31 LAB — HCG, QUANTITATIVE, PREGNANCY: hCG, Beta Chain, Quant, S: 4653 m[IU]/mL — ABNORMAL HIGH (ref <5)

## 2023-03-31 MED ORDER — ACETAMINOPHEN 500 MG PO TABS
1000.0000 mg | ORAL_TABLET | Freq: Once | ORAL | Status: AC
  Administered 2023-03-31: 1000 mg via ORAL
  Filled 2023-03-31: qty 2

## 2023-03-31 NOTE — MAU Provider Note (Signed)
History     CSN: [id number]  Arrival date and time: 03/30/23 2237   Event Date/Time   First Provider Initiated Contact with Patient 03/31/23 0007      Chief Complaint  Patient presents with   Vaginal Bleeding   Abdominal Pain    Sherry Ferguson is a 37 y.o. G5P3003 at [redacted]w[redacted]d by Approximate LMP of 01/19/2023 who receives care at [facility].  She presents today for vaginal bleeding and abdominal cramping.  Patient reports she had an [place] yesterday and the findings was the same as 3 weeks ago.    Patient reports this morning (0830) she noted some spotting that was light pink.  She reports she had a known SCH. Patient reports the bleeding progressively got worse.  She reports passing clots the size of a quarter and somewhat bigger.  She reports cramping that has been "on and off all day."  She rates the pain a 7-8/10 when it occurs.   OB History     Gravida  5   Para  3   Term  3   Preterm      AB      Living  3      SAB      IAB      Ectopic      Multiple  0   Live Births  1           Past Medical History:  Diagnosis Date   Allergy    Anxiety    Asthma    Back pain    Bronchitis, acute    Depression    Headache    IUD (intrauterine device) in place    no longer has IUD- removed in 2015   Joint pain    Muscle spasm    Pre-diabetes    Stress incontinence    Vitamin D deficiency     Past Surgical History:  Procedure Laterality Date   NO PAST SURGERIES      Family History  Problem Relation Age of Onset   Asthma Mother    Colon polyps Mother    High blood pressure Mother    High Cholesterol Mother    Obesity Mother    Diabetes Father    Hyperlipidemia Father    Hypertension Father    Bronchitis Father    Heart attack Father    Heart disease Father    Depression Father    Sleep apnea Father    Obesity Father    Colon cancer Maternal Grandmother    Breast cancer Paternal Grandmother    Colon polyps Maternal Aunt    Colon polyps  Maternal Aunt    Colon cancer Maternal Uncle 46       stage 4 at diagnosis   Colon polyps Maternal Uncle    Rectal cancer Maternal Uncle    Stomach cancer Neg Hx    Esophageal cancer Neg Hx    Liver cancer Neg Hx     Social History   Tobacco Use   Smoking status: Former    Current packs/day: 0.00    Average packs/day: 0.2 packs/day for 2.0 years (0.3 ttl pk-yrs)    Types: Cigarettes    Start date: 09/03/2005    Quit date: 09/04/2007    Years since quitting: 15.5   Smokeless tobacco: Never  Vaping Use   Vaping status: Never Used  Substance Use Topics   Alcohol use: Yes    Comment: Occasionally   Drug use: No  Allergies: No Known Allergies  Medications Prior to Admission  Medication Sig Dispense Refill Last Dose   budesonide-formoterol (SYMBICORT) 160-4.5 MCG/ACT inhaler Inhale 2 puffs into the lungs 2 (two) times daily. 3 Inhaler 3 03/29/2023 at 1500   Acetaminophen (TYLENOL EXTRA STRENGTH PO) Take 1,000 mg by mouth as needed.      Aspirin-Acetaminophen-Caffeine (GOODYS EXTRA STRENGTH PO) Take by mouth. Every 6 hrs as needed      Aspirin-Salicylamide-Caffeine (BC HEADACHE POWDER PO) Take by mouth. Every 6 hrs as needed      cephALEXin (KEFLEX) 500 MG capsule Take 1 capsule (500 mg total) by mouth 3 (three) times daily. 21 capsule 0    ondansetron (ZOFRAN-ODT) 4 MG disintegrating tablet Take 1 tablet (4 mg total) by mouth every 8 (eight) hours as needed for nausea or vomiting. 20 tablet 0    oxyCODONE-acetaminophen (PERCOCET) 5-325 MG tablet Take 1 tablet by mouth every 6 (six) hours as needed. 10 tablet 0    Vitamin D, Ergocalciferol, (DRISDOL) 1.25 MG (50000 UNIT) CAPS capsule Take 1 capsule (50,000 Units total) by mouth every 7 (seven) days. 4 capsule 0     Review of Systems  Gastrointestinal:  Positive for abdominal pain. Blood in stool: Cramping. Genitourinary:  Positive for vaginal bleeding. Negative for difficulty urinating, dysuria and vaginal discharge.   Neurological:  Positive for headaches.   Physical Exam   Blood pressure 101/67, pulse 79, temperature 98 F (36.7 C), resp. rate 18, height 5\' 5"  (1.651 m), weight 135.2 kg, last menstrual period 01/19/2023.  Physical Exam Vitals reviewed. Exam conducted with a chaperone present.  Constitutional:      Appearance: She is well-developed.  HENT:     Head: Normocephalic and atraumatic.  Eyes:     Conjunctiva/sclera: Conjunctivae normal.  Cardiovascular:     Rate and Rhythm: Normal rate.  Pulmonary:     Effort: Pulmonary effort is normal. No respiratory distress.  Genitourinary:    General: Normal vulva.     Comments: Blood noted at introitus with small dime sized clot.  Cultures collected blindly. Musculoskeletal:        General: Normal range of motion.     Cervical back: Normal range of motion.  Skin:    General: Skin is warm and dry.  Neurological:     Mental Status: She is alert and oriented to person, place, and time.  Psychiatric:        Mood and Affect: Mood normal.        Behavior: Behavior normal.    MAU Course  Procedures Results for orders placed or performed during the hospital encounter of 03/30/23 (from the past 24 hour(s))  CBC     Status: None   Collection Time: 03/30/23 11:30 PM  Result Value Ref Range   WBC 8.4 4.0 - 10.5 K/uL   RBC 4.29 3.87 - 5.11 MIL/uL   Hemoglobin 12.1 12.0 - 15.0 g/dL   HCT 62.9 52.8 - 41.3 %   MCV 86.5 80.0 - 100.0 fL   MCH 28.2 26.0 - 34.0 pg   MCHC 32.6 30.0 - 36.0 g/dL   RDW 24.4 01.0 - 27.2 %   Platelets 258 150 - 400 K/uL   nRBC 0.0 0.0 - 0.2 %  Comprehensive metabolic panel     Status: Abnormal   Collection Time: 03/30/23 11:30 PM  Result Value Ref Range   Sodium 137 135 - 145 mmol/L   Potassium 4.0 3.5 - 5.1 mmol/L   Chloride 107 98 -  111 mmol/L   CO2 21 (L) 22 - 32 mmol/L   Glucose, Bld 103 (H) 70 - 99 mg/dL   BUN 11 6 - 20 mg/dL   Creatinine, Ser 1.61 0.44 - 1.00 mg/dL   Calcium 8.8 (L) 8.9 - 10.3 mg/dL    Total Protein 6.6 6.5 - 8.1 g/dL   Albumin 3.3 (L) 3.5 - 5.0 g/dL   AST 17 15 - 41 U/L   ALT 18 0 - 44 U/L   Alkaline Phosphatase 60 38 - 126 U/L   Total Bilirubin 0.5 0.3 - 1.2 mg/dL   GFR, Estimated >09 >60 mL/min   Anion gap 9 5 - 15    MDM Physical Exam Cultures: Wet Prep and GC/CT Labs: UA, UPT, CBC, hCG, ABO Ultrasound Pain Medication Assessment and Plan  37 year old G5P3003 at 10.1weeks Vaginal Bleeding Abdominal Cramping  -Reviewed POC with patient. -Exam performed and findings discussed.  -Cultures collected blindly -Patient questions if she has had a miscarriage and informed that based on lack of change in [place], from office, provider is suspicious.  However, without further evaluation no diagnosis can yet be made.  -Patient offered and accepts pain medication. -Will give tylenol now and provide additional treatment as necessary. -Patient without questions. -Send for [place] and await results.   Cherre Robins 03/31/2023, 12:07 AM   Reassessment (1:18 AM) -Results as above. -Provider to bedside to discuss. -Informed that findings are suggestive of miscarriage as no IUGS or YS noted as stated was seen in office exams. -Discussed need to follow up with primary office.  -Patient without questions. -Condolences given. -Precautions reviewed. -Encouraged to call primary office or return to MAU if symptoms worsen or with the onset of new symptoms. -Discharged to home in stable condition.  Cherre Robins MSN, CNM Advanced Practice Provider, [facility] [employer] [facility]

## 2023-04-01 LAB — GC/CHLAMYDIA PROBE AMP (~~LOC~~) NOT AT ARMC
Chlamydia: NEGATIVE
Comment: NEGATIVE
Comment: NORMAL
Neisseria Gonorrhea: NEGATIVE

## 2023-07-24 ENCOUNTER — Other Ambulatory Visit (HOSPITAL_COMMUNITY): Payer: Self-pay | Admitting: Family Medicine

## 2023-07-24 DIAGNOSIS — R1010 Upper abdominal pain, unspecified: Secondary | ICD-10-CM

## 2023-07-25 ENCOUNTER — Ambulatory Visit (HOSPITAL_COMMUNITY)
Admission: RE | Admit: 2023-07-25 | Discharge: 2023-07-25 | Disposition: A | Discharge status: Home / Self Care | Payer: Commercial Managed Care - PPO | Source: Ambulatory Visit | Attending: Family Medicine | Admitting: Family Medicine

## 2023-07-25 DIAGNOSIS — R1010 Upper abdominal pain, unspecified: Secondary | ICD-10-CM | POA: Diagnosis present

## 2023-07-25 MED ORDER — IOHEXOL 300 MG/ML  SOLN
100.0000 mL | Freq: Once | INTRAMUSCULAR | Status: AC | PRN
  Administered 2023-07-25: 100 mL via INTRAVENOUS

## 2024-03-23 ENCOUNTER — Other Ambulatory Visit (HOSPITAL_COMMUNITY): Payer: Self-pay | Admitting: Family Medicine

## 2024-03-23 DIAGNOSIS — R1013 Epigastric pain: Secondary | ICD-10-CM

## 2024-04-07 ENCOUNTER — Encounter (HOSPITAL_COMMUNITY): Admission: RE | Admit: 2024-04-07 | Source: Ambulatory Visit

## 2024-04-07 ENCOUNTER — Encounter (HOSPITAL_COMMUNITY)
Admission: RE | Admit: 2024-04-07 | Discharge: 2024-04-07 | Disposition: A | Discharge status: Home / Self Care | Source: Ambulatory Visit | Attending: Family Medicine | Admitting: Family Medicine

## 2024-04-07 DIAGNOSIS — R1013 Epigastric pain: Secondary | ICD-10-CM | POA: Diagnosis present

## 2024-04-07 MED ORDER — TECHNETIUM TC 99M MEBROFENIN IV KIT
5.2000 | PACK | Freq: Once | INTRAVENOUS | Status: AC
  Administered 2024-04-07: 5.2 via INTRAVENOUS

## 2024-04-15 ENCOUNTER — Ambulatory Visit: Payer: Self-pay | Admitting: General Surgery

## 2024-04-15 DIAGNOSIS — K828 Other specified diseases of gallbladder: Secondary | ICD-10-CM

## 2024-04-15 NOTE — Patient Instructions (Signed)
 SURGICAL WAITING ROOM VISITATION Patients having surgery or a procedure may have no more than 2 support people in the waiting area - these visitors may rotate in the visitor waiting room.   Due to an increase in RSV and influenza rates and associated hospitalizations, children ages 52 and under may not visit patients in [facility] hospitals. If the patient needs to stay at the hospital during part of their recovery, the visitor guidelines for inpatient rooms apply.  PRE-OP VISITATION  Pre-op nurse will coordinate an appropriate time for 1 support person to accompany the patient in pre-op.  This support person may not rotate.  This visitor will be contacted when the time is appropriate for the visitor to come back in the pre-op area.  Please refer to the [facility] website for the visitor guidelines for Inpatients (after your surgery is over and you are in a regular room).  You are not required to quarantine at this time prior to your surgery. However, you must do this: Hand Hygiene often Do NOT share personal items Notify your provider if you are in close contact with someone who has COVID or you develop fever 100.4 or greater, new onset of sneezing, cough, sore throat, shortness of breath or body aches.  If you test positive for Covid or have been in contact with anyone that has tested positive in the last 10 days please notify you surgeon.    Your procedure is scheduled on:  04/23/24  Report to [facility] Main Entrance: [place] entrance where the [employer] is available.   Report to admitting at: 8:45 AM  Call this number if you have any questions or problems the morning of surgery [phone]  FOLLOW ANY ADDITIONAL PRE OP INSTRUCTIONS YOU RECEIVED FROM YOUR SURGEON'S OFFICE!!!  Eat a light diet the day before surgery.  Examples including soups, broths, toast, yogurt, mashed potatoes.  Things to avoid include carbonated beverages (fizzy beverages), raw fruits and raw  vegetables, or beans.   If your bowels are filled with gas, your surgeon will have difficulty visualizing your pelvic organs which increases your surgical risks.  Do not eat food or drink fluids after Midnight the night prior to your surgery/procedure.   Oral Hygiene is also important to reduce your risk of infection.        Remember - BRUSH YOUR TEETH THE MORNING OF SURGERY WITH YOUR REGULAR TOOTHPASTE  Do NOT smoke after Midnight the night before surgery.  STOP TAKING all Vitamins, Herbs and supplements 1 week before your surgery.   Take ONLY these medicines the morning of surgery with A SIP OF WATER: omeprazole.Use inhalers as usual and bring them.  If You have been diagnosed with Sleep Apnea - Bring CPAP mask and tubing day of surgery. We will provide you with a CPAP machine on the day of your surgery.                   You may not have any metal on your body including hair pins, jewelry, and body piercing  Do not wear make-up, lotions, powders, perfumes / cologne, or deodorant  Do not wear nail polish including gel and S&S, artificial / acrylic nails, or any other type of covering on natural nails including finger and toenails. If you have artificial nails, gel coating, etc., that needs to be removed by a nail salon, Please have this removed prior to surgery. Not doing so may mean that your surgery could be cancelled or delayed  if the Surgeon or anesthesia staff feels like they are unable to monitor you safely.   Do not shave 48 hours prior to surgery to avoid nicks in your skin which may contribute to postoperative infections.   Contacts, Hearing Aids, dentures or bridgework may not be worn into surgery. DENTURES WILL BE REMOVED PRIOR TO SURGERY PLEASE DO NOT APPLY Poly grip OR ADHESIVES!!!  You may bring a small overnight bag with you on the day of surgery, only pack items that are not valuable. [facility] IS NOT RESPONSIBLE   FOR VALUABLES THAT ARE LOST OR STOLEN.   Patients  discharged on the day of surgery will not be allowed to drive home.  Someone NEEDS to stay with you for the first 24 hours after anesthesia.  Do not bring your home medications to the hospital. The Pharmacy will dispense medications listed on your medication list to you during your admission in the Hospital.  Special Instructions: Bring a copy of your healthcare power of attorney and living will documents the day of surgery, if you wish to have them scanned into your [facility] Medical Records- EPIC  Please read over the following fact sheets you were given: IF YOU HAVE QUESTIONS ABOUT YOUR PRE-OP INSTRUCTIONS, PLEASE CALL [phone]   [facility] - Preparing for Surgery Before surgery, you can play an important role.  Because skin is not sterile, your skin needs to be as free of germs as possible.  You can reduce the number of germs on your skin by washing with CHG (chlorahexidine gluconate) soap before surgery.  CHG is an antiseptic cleaner which kills germs and bonds with the skin to continue killing germs even after washing. Please DO NOT use if you have an allergy to CHG or antibacterial soaps.  If your skin becomes reddened/irritated stop using the CHG and inform your nurse when you arrive at Short Stay. Do not shave (including legs and underarms) for at least 48 hours prior to the first CHG shower.  You may shave your face/neck.  Please follow these instructions carefully:  1.  Shower with CHG Soap the night before surgery and the  morning of surgery.  2.  If you choose to wash your hair, wash your hair first as usual with your normal  shampoo.  3.  After you shampoo, rinse your hair and body thoroughly to remove the shampoo.                             4.  Use CHG as you would any other liquid soap.  You can apply chg directly to the skin and wash.  Gently with a scrungie or clean washcloth.  5.  Apply the CHG Soap to your body ONLY FROM THE NECK DOWN.   Do not use on face/ open                            Wound or open sores. Avoid contact with eyes, ears mouth and genitals (private parts).                       Wash face,  Genitals (private parts) with your normal soap.             6.  Wash thoroughly, paying special attention to the area where your  surgery  will be performed.  7.  Thoroughly rinse your body with  warm water from the neck down.  8.  DO NOT shower/wash with your normal soap after using and rinsing off the CHG Soap.            9.  Pat yourself dry with a clean towel.            10.  Wear clean pajamas.            11.  Place clean sheets on your bed the night of your first shower and do not  sleep with pets.  ON THE DAY OF SURGERY : Do not apply any lotions/deodorants the morning of surgery.  Please wear clean clothes to the hospital/surgery center.     FAILURE TO FOLLOW THESE INSTRUCTIONS MAY RESULT IN THE CANCELLATION OF YOUR SURGERY  PATIENT SIGNATURE_________________________________  NURSE SIGNATURE__________________________________  ________________________________________________________________________

## 2024-04-16 ENCOUNTER — Other Ambulatory Visit: Payer: Self-pay

## 2024-04-16 ENCOUNTER — Encounter (HOSPITAL_COMMUNITY): Payer: Self-pay

## 2024-04-16 ENCOUNTER — Encounter (HOSPITAL_COMMUNITY)
Admission: RE | Admit: 2024-04-16 | Discharge: 2024-04-16 | Disposition: A | Discharge status: Home / Self Care | Source: Ambulatory Visit | Attending: General Surgery | Admitting: General Surgery

## 2024-04-16 VITALS — BP 131/95 | HR 91 | Temp 98.4°F | Ht 65.0 in | Wt 305.0 lb

## 2024-04-16 DIAGNOSIS — Z01812 Encounter for preprocedural laboratory examination: Secondary | ICD-10-CM | POA: Insufficient documentation

## 2024-04-16 DIAGNOSIS — Z01818 Encounter for other preprocedural examination: Secondary | ICD-10-CM

## 2024-04-16 DIAGNOSIS — K828 Other specified diseases of gallbladder: Secondary | ICD-10-CM | POA: Diagnosis not present

## 2024-04-16 LAB — COMPREHENSIVE METABOLIC PANEL WITH GFR
ALT: 25 U/L (ref 0–44)
AST: 17 U/L (ref 15–41)
Albumin: 3.7 g/dL (ref 3.5–5.0)
Alkaline Phosphatase: 59 U/L (ref 38–126)
Anion gap: 10 (ref 5–15)
BUN: 12 mg/dL (ref 6–20)
CO2: 23 mmol/L (ref 22–32)
Calcium: 8.8 mg/dL — ABNORMAL LOW (ref 8.9–10.3)
Chloride: 106 mmol/L (ref 98–111)
Creatinine, Ser: 0.81 mg/dL (ref 0.44–1.00)
GFR, Estimated: >60 mL/min (ref >60)
Glucose, Bld: 114 mg/dL — ABNORMAL HIGH (ref 70–99)
Potassium: 3.8 mmol/L (ref 3.5–5.1)
Sodium: 139 mmol/L (ref 135–145)
Total Bilirubin: 0.6 mg/dL (ref 0.0–1.2)
Total Protein: 7.5 g/dL (ref 6.5–8.1)

## 2024-04-16 LAB — CBC WITH DIFFERENTIAL/PLATELET
Abs Immature Granulocytes: 0.02 K/uL (ref 0.00–0.07)
Basophils Absolute: 0 K/uL (ref 0.0–0.1)
Basophils Relative: 1 %
Eosinophils Absolute: 0.3 K/uL (ref 0.0–0.5)
Eosinophils Relative: 6 %
HCT: 42 % (ref 36.0–46.0)
Hemoglobin: 12.9 g/dL (ref 12.0–15.0)
Immature Granulocytes: 0 %
Lymphocytes Relative: 30 %
Lymphs Abs: 1.5 K/uL (ref 0.7–4.0)
MCH: 26.9 pg (ref 26.0–34.0)
MCHC: 30.7 g/dL (ref 30.0–36.0)
MCV: 87.5 fL (ref 80.0–100.0)
Monocytes Absolute: 0.4 K/uL (ref 0.1–1.0)
Monocytes Relative: 8 %
Neutro Abs: 2.7 K/uL (ref 1.7–7.7)
Neutrophils Relative %: 55 %
Platelets: 281 K/uL (ref 150–400)
RBC: 4.8 MIL/uL (ref 3.87–5.11)
RDW: 13.9 % (ref 11.5–15.5)
WBC: 4.9 K/uL (ref 4.0–10.5)
nRBC: 0 % (ref 0.0–0.2)

## 2024-04-16 NOTE — Progress Notes (Addendum)
 For Anesthesia: PCP - Cleotilde Planas, MD  Cardiologist - N/A  Bowel Prep reminder:  Chest x-ray -  EKG -  Stress Test -  ECHO -  Cardiac Cath -  Pacemaker/ICD device last checked: Pacemaker orders received: Device Rep notified:  Spinal Cord Stimulator:N/A  Sleep Study - N/A CPAP -   Fasting Blood Sugar - N/A Checks Blood Sugar _____ times a day Date and result of last Hgb A1c-  Last dose of GLP1 agonist- N/A  GLP1 instructions:   Last dose of SGLT-2 inhibitors- N/A  SGLT-2 instructions:   Blood Thinner Instructions:N/A Aspirin Instructions: Last Dose:  Activity level: Can go up a flight of stairs and activities of daily living without stopping and without chest pain and/or shortness of breath   Able to exercise without chest pain and/or shortness of breath Anesthesia review:   Patient denies shortness of breath, fever, cough and chest pain at PAT appointment   Patient verbalized understanding of instructions that were reviewed over the telephone.

## 2024-04-22 NOTE — Anesthesia Preprocedure Evaluation (Signed)
 Anesthesia Evaluation  Patient identified by MRN, date of birth, ID band Patient awake    Reviewed: Allergy & Precautions, NPO status , Patient's Chart, lab work & pertinent test results  History of Anesthesia Complications Negative for: history of anesthetic complications  Airway Mallampati: II  TM Distance: >3 FB Neck ROM: Full    Dental no notable dental hx.    Pulmonary asthma , former smoker   Pulmonary exam normal        Cardiovascular negative cardio ROS Normal cardiovascular exam     Neuro/Psych  Headaches  Anxiety Depression       GI/Hepatic negative GI ROS, Neg liver ROS,,,  Endo/Other    Class 4 obesity (BMI 51)  Renal/GU negative Renal ROS  negative genitourinary   Musculoskeletal  (+) Arthritis ,    Abdominal   Peds  Hematology negative hematology ROS (+)   Anesthesia Other Findings Day of surgery medications reviewed with patient.  Reproductive/Obstetrics                              Anesthesia Physical Anesthesia Plan  ASA: 3  Anesthesia Plan: General   Post-op Pain Management: Tylenol  PO (pre-op)* and Celebrex  PO (pre-op)*   Induction: Intravenous  PONV Risk Score and Plan: 3 and Treatment may vary due to age or medical condition, Ondansetron , Dexamethasone , Midazolam , Scopolamine  patch - Pre-op, Propofol  infusion and TIVA  Airway Management Planned: Oral ETT  Additional Equipment: None  Intra-op Plan:   Post-operative Plan: Extubation in OR  Informed Consent: I have reviewed the patients History and Physical, chart, labs and discussed the procedure including the risks, benefits and alternatives for the proposed anesthesia with the patient or authorized representative who has indicated his/her understanding and acceptance.     Dental advisory given  Plan Discussed with: CRNA  Anesthesia Plan Comments:          Anesthesia Quick Evaluation

## 2024-04-23 ENCOUNTER — Ambulatory Visit (HOSPITAL_COMMUNITY): Admitting: Medical

## 2024-04-23 ENCOUNTER — Ambulatory Visit (HOSPITAL_BASED_OUTPATIENT_CLINIC_OR_DEPARTMENT_OTHER): Admitting: Anesthesiology

## 2024-04-23 ENCOUNTER — Encounter (HOSPITAL_COMMUNITY): Payer: Self-pay | Admitting: General Surgery

## 2024-04-23 ENCOUNTER — Encounter (HOSPITAL_COMMUNITY)
Admission: RE | Disposition: A | Discharge status: Home / Self Care | Payer: Self-pay | Source: Home / Self Care | Attending: General Surgery

## 2024-04-23 ENCOUNTER — Ambulatory Visit (HOSPITAL_COMMUNITY)
Admission: RE | Admit: 2024-04-23 | Discharge: 2024-04-23 | Disposition: A | Discharge status: Home / Self Care | Attending: General Surgery | Admitting: General Surgery

## 2024-04-23 ENCOUNTER — Other Ambulatory Visit: Payer: Self-pay

## 2024-04-23 DIAGNOSIS — E6689 Other obesity not elsewhere classified: Secondary | ICD-10-CM | POA: Insufficient documentation

## 2024-04-23 DIAGNOSIS — K828 Other specified diseases of gallbladder: Secondary | ICD-10-CM | POA: Insufficient documentation

## 2024-04-23 DIAGNOSIS — R160 Hepatomegaly, not elsewhere classified: Secondary | ICD-10-CM | POA: Diagnosis not present

## 2024-04-23 DIAGNOSIS — Z87891 Personal history of nicotine dependence: Secondary | ICD-10-CM | POA: Diagnosis not present

## 2024-04-23 DIAGNOSIS — Z01818 Encounter for other preprocedural examination: Secondary | ICD-10-CM

## 2024-04-23 DIAGNOSIS — Z6841 Body Mass Index (BMI) 40.0 and over, adult: Secondary | ICD-10-CM | POA: Insufficient documentation

## 2024-04-23 DIAGNOSIS — J45909 Unspecified asthma, uncomplicated: Secondary | ICD-10-CM | POA: Diagnosis not present

## 2024-04-23 DIAGNOSIS — K8010 Calculus of gallbladder with chronic cholecystitis without obstruction: Secondary | ICD-10-CM | POA: Diagnosis not present

## 2024-04-23 DIAGNOSIS — R7303 Prediabetes: Secondary | ICD-10-CM | POA: Diagnosis not present

## 2024-04-23 HISTORY — PX: CHOLECYSTECTOMY: SHX55

## 2024-04-23 LAB — POCT PREGNANCY, URINE: Preg Test, Ur: NEGATIVE

## 2024-04-23 SURGERY — LAPAROSCOPIC CHOLECYSTECTOMY
Anesthesia: General | Site: Abdomen | Laterality: Left

## 2024-04-23 MED ORDER — ROCURONIUM BROMIDE 10 MG/ML (PF) SYRINGE
PREFILLED_SYRINGE | INTRAVENOUS | Status: AC
Start: 2024-04-23 — End: 2024-04-23
  Filled 2024-04-23: qty 10

## 2024-04-23 MED ORDER — DEXAMETHASONE SODIUM PHOSPHATE 10 MG/ML IJ SOLN
INTRAMUSCULAR | Status: DC | PRN
  Administered 2024-04-23: 8 mg via INTRAVENOUS

## 2024-04-23 MED ORDER — OXYCODONE HCL 5 MG PO TABS
ORAL_TABLET | ORAL | Status: AC
Start: 2024-04-23 — End: 2024-04-23
  Filled 2024-04-23: qty 1

## 2024-04-23 MED ORDER — PROPOFOL 10 MG/ML IV BOLUS
INTRAVENOUS | Status: DC | PRN
  Administered 2024-04-23: 115 ug/kg/min via INTRAVENOUS
  Administered 2024-04-23: 200 mg via INTRAVENOUS

## 2024-04-23 MED ORDER — LACTATED RINGERS IR SOLN
Status: DC | PRN
Start: 2024-04-23 — End: 2024-04-23
  Administered 2024-04-23: 1000 mL

## 2024-04-23 MED ORDER — ACETAMINOPHEN 500 MG PO TABS: 1000.0000 mg | ORAL_TABLET | ORAL | Status: DC

## 2024-04-23 MED ORDER — PROPOFOL 10 MG/ML IV BOLUS
INTRAVENOUS | Status: AC
  Filled 2024-04-23: qty 20

## 2024-04-23 MED ORDER — PROPOFOL 500 MG/50ML IV EMUL
INTRAVENOUS | Status: AC
  Filled 2024-04-23: qty 50

## 2024-04-23 MED ORDER — PHENYLEPHRINE 80 MCG/ML (10ML) SYRINGE FOR IV PUSH (FOR BLOOD PRESSURE SUPPORT)
PREFILLED_SYRINGE | INTRAVENOUS | Status: DC | PRN
  Administered 2024-04-23: 80 ug via INTRAVENOUS

## 2024-04-23 MED ORDER — CHLORHEXIDINE GLUCONATE CLOTH 2 % EX PADS: 6.0000 | MEDICATED_PAD | Freq: Once | CUTANEOUS | Status: DC

## 2024-04-23 MED ORDER — INDOCYANINE GREEN 25 MG IV SOLR
1.2500 mg | Freq: Once | INTRAVENOUS | Status: AC
  Administered 2024-04-23: 1.25 mg via INTRAVENOUS
  Filled 2024-04-23: qty 10

## 2024-04-23 MED ORDER — OXYCODONE HCL 5 MG PO TABS
5.0000 mg | ORAL_TABLET | Freq: Once | ORAL | Status: AC | PRN
  Administered 2024-04-23: 5 mg via ORAL

## 2024-04-23 MED ORDER — FENTANYL CITRATE (PF) 100 MCG/2ML IJ SOLN
INTRAMUSCULAR | Status: AC
  Filled 2024-04-23: qty 2

## 2024-04-23 MED ORDER — SCOPOLAMINE 1 MG/3DAYS TD PT72SCOPOLAMINE 1 MG/3DAYS
1.0000 | MEDICATED_PATCH | Freq: Once | TRANSDERMAL | Status: DC
Start: 2024-04-23 — End: 2024-04-23
  Administered 2024-04-23: 1.5 mg via TRANSDERMAL
  Filled 2024-04-23: qty 1

## 2024-04-23 MED ORDER — ACETAMINOPHEN 500 MG PO TABS
1000.0000 mg | ORAL_TABLET | Freq: Once | ORAL | Status: AC
  Administered 2024-04-23: 1000 mg via ORAL
  Filled 2024-04-23: qty 2

## 2024-04-23 MED ORDER — OXYCODONE HCL 5 MG/5ML PO SOLN: 5.0000 mg | Freq: Once | ORAL | Status: AC | PRN

## 2024-04-23 MED ORDER — OXYCODONE HCL 5 MG PO TABS: 5.0000 mg | ORAL_TABLET | ORAL | 0 refills | Status: AC | PRN

## 2024-04-23 MED ORDER — ROCURONIUM BROMIDE 100 MG/10ML IV SOLN
INTRAVENOUS | Status: DC | PRN
  Administered 2024-04-23: 40 mg via INTRAVENOUS
  Administered 2024-04-23 (×2): 10 mg via INTRAVENOUS

## 2024-04-23 MED ORDER — ONDANSETRON HCL 4 MG/2ML IJ SOLN
INTRAMUSCULAR | Status: AC
  Filled 2024-04-23: qty 2

## 2024-04-23 MED ORDER — DROPERIDOL 2.5 MG/ML IJ SOLN: 0.6250 mg | Freq: Once | INTRAMUSCULAR | Status: DC | PRN

## 2024-04-23 MED ORDER — ORAL CARE MOUTH RINSE: 15.0000 mL | Freq: Once | OROMUCOSAL | Status: AC

## 2024-04-23 MED ORDER — BUPIVACAINE-EPINEPHRINE (PF) 0.25% -1:200000 IJ SOLN
INTRAMUSCULAR | Status: AC
  Filled 2024-04-23: qty 30

## 2024-04-23 MED ORDER — SUGAMMADEX SODIUM 200 MG/2ML IV SOLN
INTRAVENOUS | Status: DC | PRN
Start: 2024-04-23 — End: 2024-04-23
  Administered 2024-04-23: 550 mg via INTRAVENOUS

## 2024-04-23 MED ORDER — CEFAZOLIN SODIUM-DEXTROSE 3-4 GM/150ML-% IV SOLN
3.0000 g | INTRAVENOUS | Status: AC
  Administered 2024-04-23: 3 g via INTRAVENOUS
  Filled 2024-04-23: qty 150

## 2024-04-23 MED ORDER — SUGAMMADEX SODIUM 200 MG/2ML IV SOLN
INTRAVENOUS | Status: AC
  Filled 2024-04-23: qty 6

## 2024-04-23 MED ORDER — FENTANYL CITRATE (PF) 100 MCG/2ML IJ SOLN
INTRAMUSCULAR | Status: DC | PRN
  Administered 2024-04-23: 100 ug via INTRAVENOUS
  Administered 2024-04-23: 50 ug via INTRAVENOUS

## 2024-04-23 MED ORDER — GABAPENTIN 300 MG PO CAPS
300.0000 mg | ORAL_CAPSULE | ORAL | Status: AC
  Administered 2024-04-23: 300 mg via ORAL
  Filled 2024-04-23: qty 1

## 2024-04-23 MED ORDER — FENTANYL CITRATE PF 50 MCG/ML IJ SOSY: 25.0000 ug | PREFILLED_SYRINGE | INTRAMUSCULAR | Status: DC | PRN

## 2024-04-23 MED ORDER — PROPOFOL 1000 MG/100ML IV EMUL
INTRAVENOUS | Status: AC
Start: 2024-04-23 — End: 2024-04-23
  Filled 2024-04-23: qty 100

## 2024-04-23 MED ORDER — ENOXAPARIN SODIUM 40 MG/0.4ML IJ SOSY
40.0000 mg | PREFILLED_SYRINGE | Freq: Once | INTRAMUSCULAR | Status: AC
  Administered 2024-04-23: 40 mg via SUBCUTANEOUS
  Filled 2024-04-23: qty 0.4

## 2024-04-23 MED ORDER — LACTATED RINGERS IV SOLN: INTRAVENOUS | Status: DC

## 2024-04-23 MED ORDER — CHLORHEXIDINE GLUCONATE 0.12 % MT SOLN
15.0000 mL | Freq: Once | OROMUCOSAL | Status: AC
  Administered 2024-04-23: 15 mL via OROMUCOSAL

## 2024-04-23 MED ORDER — 0.9 % SODIUM CHLORIDE (POUR BTL) OPTIME
TOPICAL | Status: DC | PRN
Start: 2024-04-23 — End: 2024-04-23
  Administered 2024-04-23: 1000 mL

## 2024-04-23 MED ORDER — ONDANSETRON HCL 4 MG/2ML IJ SOLN
INTRAMUSCULAR | Status: DC | PRN
  Administered 2024-04-23: 4 mg via INTRAVENOUS

## 2024-04-23 MED ORDER — BUPIVACAINE-EPINEPHRINE 0.25% -1:200000 IJ SOLN
INTRAMUSCULAR | Status: DC | PRN
  Administered 2024-04-23: 30 mL

## 2024-04-23 MED ORDER — MIDAZOLAM HCL 2 MG/2ML IJ SOLN
INTRAMUSCULAR | Status: AC
  Filled 2024-04-23: qty 2

## 2024-04-23 MED ORDER — LIDOCAINE HCL (PF) 2 % IJ SOLN
INTRAMUSCULAR | Status: AC
Start: 2024-04-23 — End: 2024-04-23
  Filled 2024-04-23: qty 5

## 2024-04-23 MED ORDER — LIDOCAINE HCL (CARDIAC) PF 100 MG/5ML IV SOSY
PREFILLED_SYRINGE | INTRAVENOUS | Status: DC | PRN
  Administered 2024-04-23: 100 mg via INTRAVENOUS

## 2024-04-23 MED ORDER — LACTATED RINGERS IV SOLN: INTRAVENOUS | Status: DC | PRN

## 2024-04-23 MED ORDER — CELECOXIB 200 MG PO CAPS
200.0000 mg | ORAL_CAPSULE | ORAL | Status: AC
  Administered 2024-04-23: 200 mg via ORAL
  Filled 2024-04-23: qty 1

## 2024-04-23 MED ORDER — SUCCINYLCHOLINE CHLORIDE 200 MG/10ML IV SOSY
PREFILLED_SYRINGE | INTRAVENOUS | Status: DC | PRN
Start: 2024-04-23 — End: 2024-04-23
  Administered 2024-04-23: 200 mg via INTRAVENOUS

## 2024-04-23 MED ORDER — MIDAZOLAM HCL 5 MG/5ML IJ SOLN
INTRAMUSCULAR | Status: DC | PRN
  Administered 2024-04-23: 2 mg via INTRAVENOUS

## 2024-04-23 SURGICAL SUPPLY — 34 items
BAG COUNTER SPONGE SURGICOUNT (BAG) ×1 IMPLANT
CHLORAPREP W/TINT 26 (MISCELLANEOUS) ×1 IMPLANT
CLIP LIGATING HEMO O LOK GREEN (MISCELLANEOUS) ×1 IMPLANT
COVER MAYO STAND XLG (MISCELLANEOUS) IMPLANT
COVER SURGICAL LIGHT HANDLE (MISCELLANEOUS) ×1 IMPLANT
DERMABOND ADVANCED .7 DNX12 (GAUZE/BANDAGES/DRESSINGS) ×1 IMPLANT
DRAPE C-ARM 42X120 X-RAY (DRAPES) IMPLANT
ELECT REM PT RETURN 15FT ADLT (MISCELLANEOUS) ×1 IMPLANT
GLOVE INDICATOR 6.5 STRL GRN (GLOVE) ×1 IMPLANT
GLOVE SURG MICRO LTX SZ6 (GLOVE) ×1 IMPLANT
GOWN STRL REUS W/ TWL LRG LVL3 (GOWN DISPOSABLE) ×1 IMPLANT
GRASPER SUT TROCAR 14GX15 (MISCELLANEOUS) ×1 IMPLANT
IRRIGATION SUCT STRKRFLW 2 WTP (MISCELLANEOUS) ×1 IMPLANT
KIT BASIN OR (CUSTOM PROCEDURE TRAY) ×1 IMPLANT
KIT IMAGING PINPOINTPAQ (MISCELLANEOUS) IMPLANT
KIT TURNOVER KIT A (KITS) ×1 IMPLANT
LHOOK LAP DISP 36CM (ELECTROSURGICAL) ×1 IMPLANT
NDL INSUFFLATION 14GA 120MM (NEEDLE) ×1 IMPLANT
NEEDLE INSUFFLATION 14GA 120MM (NEEDLE) ×1 IMPLANT
NS IRRIG 1000ML POUR BTL (IV SOLUTION) ×1 IMPLANT
PENCIL BUTTON HOLSTER BLD 10FT (ELECTRODE) ×1 IMPLANT
POUCH LAPAROSCOPIC INSTRUMENT (MISCELLANEOUS) ×1 IMPLANT
SCISSORS LAP 5X35 DISP (ENDOMECHANICALS) ×1 IMPLANT
SET CHOLANGIOGRAPH MIX (MISCELLANEOUS) IMPLANT
SET TUBE SMOKE EVAC HIGH FLOW (TUBING) ×1 IMPLANT
SLEEVE Z-THREAD 5X100MM (TROCAR) ×2 IMPLANT
SUT MNCRL AB 4-0 PS2 18 (SUTURE) ×1 IMPLANT
SUT VICRYL 0 UR6 27IN ABS (SUTURE) IMPLANT
SYSTEM BAG RETRIEVAL 10MM (BASKET) ×1 IMPLANT
TOWEL GREEN STERILE FF (TOWEL DISPOSABLE) ×1 IMPLANT
TRAY LAPAROSCOPIC (CUSTOM PROCEDURE TRAY) ×1 IMPLANT
TROCAR 11X100 Z THREAD (TROCAR) IMPLANT
TROCAR Z THREAD OPTICAL 12X100 (TROCAR) ×1 IMPLANT
TROCAR Z-THREAD OPTICAL 5X100M (TROCAR) ×1 IMPLANT

## 2024-04-23 NOTE — Op Note (Signed)
 04/23/2024 11:33 AM  PATIENT: Sherry Ferguson  38 y.o. female  Patient Care Team: Cleotilde Planas, MD as PCP - General (Family Medicine)  PRE-OPERATIVE DIAGNOSIS: biliary dyskinesia  POST-OPERATIVE DIAGNOSIS: biliary dyskinesia  PROCEDURE: laparoscopic cholecystectomy with ICG  SURGEON: Orie Silversmith, MD  ASSISTANT: None  ANESTHESIA: General endotracheal  EBL: 5cc  DRAINS: None  SPECIMEN: Gallbladder  COUNTS: Sponge, needle and instrument counts were reported correct x2 at the conclusion of the operation  DISPOSITION: PACU in satisfactory condition  COMPLICATIONS: None  FINDINGS: Hepatomegaly. ICG showed fluorescence of cystic duct, CBD, and into small bowel. Critical view achieved prior to clipping cystic artery and duct.  DESCRIPTION:  Preoperative indocyanine green  was administered in preoperative holding. The patient was identified & brought into the operating room. She was then positioned supine on the OR table. SCDs were in place and active during the entire case. She then underwent general endotracheal anesthesia. Pressure points were padded. Hair on the abdomen was clipped by the OR team. The abdomen was prepped and draped in the standard sterile fashion. Antibiotics were administered. A surgical timeout was performed and confirmed our plan.  A small incision was made in the LUQ at Palmer's point and a veress needle was inserted. Air was aspirated and subsequent positive drop test. Abdomen then insufflated to . A periumbilical incision was then made and a 5mm trocar optiview using a 30 degree scope was inserted and the abdomen was entered under direct visualization. Inspection confirmed no evidence of trocar or veress site complications. The veress was then removed.    The patient was then positioned in reverse Trendelenburg with slight left side down. A 12 mm supxiphoid trocar was placed under direct visualization and  two additional 5mm trocars were placed along the  right subcostal line - one 5mm port in mid subcostal region, another 5mm port in the right flank near the anterior axillary line.  The liver and gallbladder were inspected. There were some omental adhesions to right lobe of liver that were carefully taken down with electrocautery. The gallbladder fundus was grasped and elevated cephalad. An additional grasper was then placed on the infundibulum of the gallbladder and the infundibulum was retracted laterally. Staying high on the gallbladder, the peritoneum on both sides of the gallbladder was opened with hook cautery. Gentle blunt dissection was then employed with a [place]  dissector working down into [employer]. The cystic duct was identified and carefully circumferentially dissected. The cystic artery was also identified and carefully circumferentially dissected. The space between the cystic artery and hepatocystic plate was developed such that a good view of the liver could be seen through a window medial to the cystic artery. The triangle of Calot had been cleared of all fibrofatty tissue. At this point, a critical view of safety was achieved and the only structures visualized was the skeletonized cystic duct laterally, the skeletonized cystic artery and the liver through the window medial to the artery. No posterior cystic artery was noted  Attention turned to infrared fluorescent cholangiography with indocyanine green  which was visualized within the common hepatic duct, common bile duct, cystic duct and small bowel.  The cystic duct and artery were clipped with 2 hemolock clips on the patient side and 1 clip on the specimen side. The cystic duct and artery were then divided. The gallbladder was then freed from its remaining attachments to the liver using electrocautery and placed into an endocatch bag. There was a small violation of gallbladder wall during this with minimal  bile spillage. The RUQ was gently irrigated with sterile saline. Hemostasis  was then verified. The clips were in good position; the gallbladder fossa was dry. The rest of the abdomen was inspected no injury nor bleeding elsewhere was identified.  The endocatch bag containing the gallbladder was then removed from the subxiphoid port site and passed off as specimen. The subxiphoid port fascia was then closed in a figured of eight fashion with 0 vicryl using a suture passer. The RUQ ports were removed under direct visualization and noted to be hemostatic.SABRA The fascia was palpated and noted to be completely closed. The abdomen was then desufflated and the periumbilical trocar removed. The skin of all incision sites was approximated with 4-0 monocryl subcuticular suture and dermabond applied. Patient was then awakened from anesthesia, extubated, and transferred to a stretcher for transport to PACU in satisfactory condition.  Instrument, sponge, and needle counts were correct at closure and at the conclusion of the case.   Orie Silversmith, MD [facility] Surgery

## 2024-04-23 NOTE — Anesthesia Postprocedure Evaluation (Signed)
 Anesthesia Post Note  Patient: Sherry Ferguson  Procedure(s) Performed: LAPAROSCOPIC CHOLECYSTECTOMY (Left: Abdomen)     Patient location during evaluation: PACU Anesthesia Type: General Level of consciousness: awake and alert Pain management: pain level controlled Vital Signs Assessment: post-procedure vital signs reviewed and stable Respiratory status: spontaneous breathing, nonlabored ventilation and respiratory function stable Cardiovascular status: blood pressure returned to baseline Postop Assessment: no apparent nausea or vomiting Anesthetic complications: no   No notable events documented.  Last Vitals:  Vitals:   04/23/24 0818 04/23/24 1200  BP: 131/85 (!) 94/57  Pulse: 100 82  Resp: 16 17  Temp: 36.9 C 36.5 C  SpO2: 97% 98%    Last Pain:  Vitals:   04/23/24 1315  TempSrc:   PainSc: 4                  Vertell Row

## 2024-04-23 NOTE — Anesthesia Procedure Notes (Signed)
 Procedure Name: Intubation Date/Time: 04/23/2024 10:22 AM  Performed by: Obadiah Reyes BROCKS, CRNAPre-anesthesia Checklist: Patient identified, Emergency Drugs available, Suction available and Patient being monitored Patient Re-evaluated:Patient Re-evaluated prior to induction Oxygen Delivery Method: Circle System Utilized Preoxygenation: Pre-oxygenation with 100% oxygen Induction Type: IV induction Ventilation: Mask ventilation without difficulty Laryngoscope Size: Miller and 2 Grade View: Grade II Tube type: Oral Tube size: 7.0 mm Number of attempts: 1 Airway Equipment and Method: Stylet and Oral airway Placement Confirmation: ETT inserted through vocal cords under direct vision, positive ETCO2 and breath sounds checked- equal and bilateral Secured at: 20 cm Tube secured with: Tape Dental Injury: Teeth and Oropharynx as per pre-operative assessment

## 2024-04-23 NOTE — Transfer of Care (Signed)
 Immediate Anesthesia Transfer of Care Note  Patient: Sherry Ferguson  Procedure(s) Performed: LAPAROSCOPIC CHOLECYSTECTOMY (Left: Abdomen)  Patient Location: PACU  Anesthesia Type:General  Level of Consciousness: sedated and responds to stimulation  Airway & Oxygen Therapy: Patient Spontanous Breathing and Patient connected to face mask oxygen  Post-op Assessment: Report given to RN and Post -op Vital signs reviewed and stable  Post vital signs: Reviewed and stable  Last Vitals:  Vitals Value Taken Time  BP 94/57 04/23/24 12:00  Temp 36.5 C 04/23/24 12:00  Pulse 82 04/23/24 12:03  Resp 21 04/23/24 12:03  SpO2 95 % 04/23/24 12:03  Vitals shown include unfiled device data.  Last Pain:  Vitals:   04/23/24 0818  TempSrc: Oral         Complications: No notable events documented.

## 2024-04-23 NOTE — H&P (Signed)
 HPI  NOTNAMED Sherry Ferguson is an 38 y.o. female who was seen in clinic on 04/15/24 for biliary dyskinesia.  Patient has had RUQ abdominal pain since October. Intermittent but frequently occurs several days per week if not daily. Is triggered by food. Associated with nausea occasionally, never emesis. She has been seen by GI in the past for abdominal discomfort and bloating and was treated for constipation. Even after mag citrate and miralax and feeling as though she is very well cleaned out still has RUQ pain. Has also taken omeprazole for presumed reflux but never alleviated her symptoms.  She has avoided fatty and greasy foods and has been eating less due to frequency of pain when she does eat.  HIDA showed EF of 14%.   10 point review of systems is negative except as listed above in HPI.  Objective  Past Medical History: Past Medical History:  Diagnosis Date   Allergy    Anxiety    Asthma    Back pain    Bronchitis, acute    Depression    Headache    IUD (intrauterine device) in place    no longer has IUD- removed in 2015   Joint pain    Muscle spasm    Pre-diabetes    Stress incontinence    Vitamin D  deficiency     Past Surgical History: Past Surgical History:  Procedure Laterality Date   COLONOSCOPY     NO PAST SURGERIES      Family History:  Family History  Problem Relation Age of Onset   Asthma Mother    Colon polyps Mother    High blood pressure Mother    High Cholesterol Mother    Obesity Mother    Diabetes Father    Hyperlipidemia Father    Hypertension Father    Bronchitis Father    Heart attack Father    Heart disease Father    Depression Father    Sleep apnea Father    Obesity Father    Colon cancer Maternal Grandmother    Breast cancer Paternal Grandmother    Colon polyps Maternal Aunt    Colon polyps Maternal Aunt    Colon cancer Maternal Uncle 46       stage 4 at diagnosis   Colon polyps Maternal Uncle    Rectal cancer Maternal Uncle     Stomach cancer Neg Hx    Esophageal cancer Neg Hx    Liver cancer Neg Hx     Social History:  reports that she quit smoking about 16 years ago. Her smoking use included cigarettes. She started smoking about 18 years ago. She has a 0.3 pack-year smoking history. She has never used smokeless tobacco. She reports that she does not currently use alcohol. She reports that she does not use drugs.  Allergies: No Known Allergies  Medications: I have reviewed the patient's current medications.  Labs: Pertinent lab work personally reviewed.  Imaging: Pertinent imaging personally reviewed  HIDA 04/07/24: GB EF 14% CT AP 07/25/23: Hepatic steatosis, no gallstones, GB wall thickening or pericholecystic fluid.   Physical Exam Blood pressure 131/85, pulse 100, temperature 98.4 F (36.9 C), temperature source Oral, resp. rate 16, height 5' 5 (1.651 m), weight (!) 138.3 kg, last menstrual period 03/29/2024, SpO2 97%. General: No acute distress, well appearing HEENT: PERRL, hearing grossly normal, mucous membranes moist CV: Regular rate and rhythm Pulm: Normal work of breathing on room air Abd: Soft, obese, nondistended, tender to palpation in  RUQ Extremities: Warm and well perfused Neuro: A&O x4, no focal neurologic deficits Psych: Appropriate mood and effect     Assessment   Sherry Ferguson is an 38 y.o. female with morbid obesity and biliary dyskinesia, enlarged fatty liver  Plan  - Proceed to OR for laparoscopic cholecystectomy with ICG - We discussed the etiology of patient's pain, we discussed treatment options and recommended surgery. We discussed details of surgery including general anesthesia, laparoscopic approach, identification of cystic duct and common bile duct. Ligation of cystic duct and cystic artery. Possible need for intraoperative cholangiogram, open procedure, and subtotal cholecystectomy. Possible risks of common bile duct injury, injury to surrounding structures, bile leak,  bleeding, infection, diarrhea, retained stone and hernia. The patient showed good understanding and all questions were answered.    Orie Silversmith, MD [facility] Surgery

## 2024-04-24 ENCOUNTER — Encounter (HOSPITAL_COMMUNITY): Payer: Self-pay | Admitting: General Surgery

## 2024-04-24 LAB — SURGICAL PATHOLOGY

## 2024-05-12 ENCOUNTER — Other Ambulatory Visit: Payer: Self-pay | Admitting: General Surgery

## 2024-05-12 DIAGNOSIS — Z9049 Acquired absence of other specified parts of digestive tract: Secondary | ICD-10-CM

## 2024-05-20 ENCOUNTER — Ambulatory Visit
Admission: RE | Admit: 2024-05-20 | Discharge: 2024-05-20 | Disposition: A | Discharge status: Home / Self Care | Source: Ambulatory Visit | Attending: General Surgery | Admitting: General Surgery

## 2024-05-20 DIAGNOSIS — Z9049 Acquired absence of other specified parts of digestive tract: Secondary | ICD-10-CM

## 2024-05-21 ENCOUNTER — Inpatient Hospital Stay
Admission: RE | Admit: 2024-05-21 | Discharge: 2024-05-21 | Discharge status: Home / Self Care | Source: Ambulatory Visit | Attending: General Surgery | Admitting: General Surgery
